# Patient Record
Sex: Female | Born: 1955 | Race: White | Hispanic: No | Smoking: Former smoker
Health system: Southern US, Community
[De-identification: ages and names within clinical notes are randomized; demographics above are authoritative.]

## PROBLEM LIST (undated history)

## (undated) DIAGNOSIS — E039 Hypothyroidism, unspecified: Secondary | ICD-10-CM

## (undated) DIAGNOSIS — M542 Cervicalgia: Secondary | ICD-10-CM

## (undated) DIAGNOSIS — J069 Acute upper respiratory infection, unspecified: Secondary | ICD-10-CM

## (undated) DIAGNOSIS — F32A Depression, unspecified: Secondary | ICD-10-CM

## (undated) DIAGNOSIS — M25519 Pain in unspecified shoulder: Secondary | ICD-10-CM

## (undated) DIAGNOSIS — L309 Dermatitis, unspecified: Secondary | ICD-10-CM

## (undated) DIAGNOSIS — F329 Major depressive disorder, single episode, unspecified: Secondary | ICD-10-CM

## (undated) DIAGNOSIS — C801 Malignant (primary) neoplasm, unspecified: Secondary | ICD-10-CM

## (undated) DIAGNOSIS — F419 Anxiety disorder, unspecified: Secondary | ICD-10-CM

## (undated) DIAGNOSIS — G43909 Migraine, unspecified, not intractable, without status migrainosus: Secondary | ICD-10-CM

## (undated) DIAGNOSIS — M1990 Unspecified osteoarthritis, unspecified site: Secondary | ICD-10-CM

## (undated) DIAGNOSIS — G4700 Insomnia, unspecified: Secondary | ICD-10-CM

## (undated) HISTORY — DX: Pain in unspecified shoulder: M25.519

## (undated) HISTORY — PX: COLONOSCOPY: SHX174

## (undated) HISTORY — DX: Dermatitis, unspecified: L30.9

## (undated) HISTORY — PX: APPENDECTOMY: SHX54

## (undated) HISTORY — DX: Depression, unspecified: F32.A

## (undated) HISTORY — DX: Migraine, unspecified, not intractable, without status migrainosus: G43.909

## (undated) HISTORY — DX: Acute upper respiratory infection, unspecified: J06.9

## (undated) HISTORY — DX: Anxiety disorder, unspecified: F41.9

## (undated) HISTORY — PX: TUBAL LIGATION: SHX77

## (undated) HISTORY — DX: Insomnia, unspecified: G47.00

## (undated) HISTORY — DX: Hypothyroidism, unspecified: E03.9

## (undated) HISTORY — PX: DILATION AND CURETTAGE OF UTERUS: SHX78

## (undated) HISTORY — DX: Cervicalgia: M54.2

## (undated) HISTORY — PX: TONSILLECTOMY: SUR1361

## (undated) HISTORY — PX: WISDOM TOOTH EXTRACTION: SHX21

## (undated) HISTORY — DX: Major depressive disorder, single episode, unspecified: F32.9

---

## 1997-03-15 ENCOUNTER — Ambulatory Visit (HOSPITAL_COMMUNITY): Admission: RE | Admit: 1997-03-15 | Discharge: 1997-03-15 | Payer: Self-pay | Admitting: Obstetrics and Gynecology

## 1997-08-20 ENCOUNTER — Ambulatory Visit (HOSPITAL_COMMUNITY): Admission: RE | Admit: 1997-08-20 | Discharge: 1997-08-20 | Payer: Self-pay | Admitting: Obstetrics and Gynecology

## 1997-09-20 ENCOUNTER — Ambulatory Visit (HOSPITAL_COMMUNITY): Admission: RE | Admit: 1997-09-20 | Discharge: 1997-09-20 | Payer: Self-pay | Admitting: Obstetrics and Gynecology

## 1997-10-15 ENCOUNTER — Inpatient Hospital Stay (HOSPITAL_COMMUNITY): Admission: AD | Admit: 1997-10-15 | Discharge: 1997-10-18 | Payer: Self-pay | Admitting: Obstetrics and Gynecology

## 1997-11-26 ENCOUNTER — Other Ambulatory Visit: Admission: RE | Admit: 1997-11-26 | Discharge: 1997-11-26 | Payer: Self-pay | Admitting: Obstetrics and Gynecology

## 1997-12-14 ENCOUNTER — Encounter (HOSPITAL_COMMUNITY): Admission: RE | Admit: 1997-12-14 | Discharge: 1998-03-14 | Payer: Self-pay | Admitting: *Deleted

## 1998-12-24 ENCOUNTER — Other Ambulatory Visit: Admission: RE | Admit: 1998-12-24 | Discharge: 1998-12-24 | Payer: Self-pay | Admitting: Obstetrics and Gynecology

## 1999-12-30 ENCOUNTER — Other Ambulatory Visit: Admission: RE | Admit: 1999-12-30 | Discharge: 1999-12-30 | Payer: Self-pay | Admitting: Obstetrics and Gynecology

## 2001-01-24 ENCOUNTER — Other Ambulatory Visit: Admission: RE | Admit: 2001-01-24 | Discharge: 2001-01-24 | Payer: Self-pay | Admitting: Obstetrics and Gynecology

## 2002-03-13 ENCOUNTER — Other Ambulatory Visit: Admission: RE | Admit: 2002-03-13 | Discharge: 2002-03-13 | Payer: Self-pay | Admitting: Obstetrics and Gynecology

## 2003-04-16 ENCOUNTER — Other Ambulatory Visit: Admission: RE | Admit: 2003-04-16 | Discharge: 2003-04-16 | Payer: Self-pay | Admitting: Obstetrics and Gynecology

## 2004-05-28 ENCOUNTER — Other Ambulatory Visit: Admission: RE | Admit: 2004-05-28 | Discharge: 2004-05-28 | Payer: Self-pay | Admitting: Obstetrics and Gynecology

## 2004-08-13 ENCOUNTER — Encounter: Admission: RE | Admit: 2004-08-13 | Discharge: 2004-08-13 | Payer: Self-pay | Admitting: Internal Medicine

## 2006-03-08 ENCOUNTER — Ambulatory Visit (HOSPITAL_COMMUNITY): Admission: RE | Admit: 2006-03-08 | Discharge: 2006-03-08 | Payer: Self-pay | Admitting: *Deleted

## 2009-08-17 ENCOUNTER — Emergency Department (HOSPITAL_BASED_OUTPATIENT_CLINIC_OR_DEPARTMENT_OTHER): Admission: EM | Admit: 2009-08-17 | Discharge: 2009-08-17 | Payer: Self-pay | Admitting: Emergency Medicine

## 2011-01-26 ENCOUNTER — Other Ambulatory Visit: Payer: Self-pay | Admitting: Obstetrics and Gynecology

## 2014-02-21 ENCOUNTER — Ambulatory Visit: Payer: PRIVATE HEALTH INSURANCE | Admitting: Neurology

## 2014-03-13 ENCOUNTER — Other Ambulatory Visit: Payer: Self-pay | Admitting: Obstetrics and Gynecology

## 2014-03-15 LAB — CYTOLOGY - PAP

## 2014-11-01 ENCOUNTER — Encounter: Payer: Self-pay | Admitting: Neurology

## 2014-11-01 ENCOUNTER — Ambulatory Visit (INDEPENDENT_AMBULATORY_CARE_PROVIDER_SITE_OTHER): Payer: 59 | Admitting: Neurology

## 2014-11-01 VITALS — BP 112/68 | HR 62 | Ht 63.0 in | Wt 113.0 lb

## 2014-11-01 DIAGNOSIS — G43709 Chronic migraine without aura, not intractable, without status migrainosus: Secondary | ICD-10-CM

## 2014-11-01 DIAGNOSIS — IMO0002 Reserved for concepts with insufficient information to code with codable children: Secondary | ICD-10-CM

## 2014-11-01 MED ORDER — SUMATRIPTAN SUCCINATE REFILL 6 MG/0.5ML ~~LOC~~ SOCT: 6.0000 mg | SUBCUTANEOUS | Status: DC | PRN

## 2014-11-01 MED ORDER — NORTRIPTYLINE HCL 10 MG PO CAPS: ORAL_CAPSULE | ORAL | Status: DC

## 2014-11-01 NOTE — Progress Notes (Signed)
PATIENT: Abigail Stevens DOB: 10-18-1955  Chief Complaint  Patient presents with  . Migraine    She has been having migraines since was 59 years old.  They have recently become worse and more frequent.  This week she has had three migraines.  She has tried and failed multiple medications in the past.  She has tried magnesium, chiropractic care, acupuncture, eye exams, neck/back stretches, massage therapy and diet. She limits her OTC NSAID use.  She sometimes gets relief with sumatriptan, ice and rest in a dark, quiet place.     HISTORICAL  Abigail Stevens is a 59 years old right-handed female, seen in refer by her primary care physician from [facility] Dr. Jani Gravel in November 01 2014 for evaluation of frequent headaches  She reported a history of migraine headaches since 29 years old, her father also suffered typical migraines, her typical migraines are lateralized severe pounding headache was associated light noise sensitivity, nauseous, lasting for a few hours.  She has gone through a lot of stress since 2015, complains of increased headache over the past 2 years, for a while, she has been taking frequent ibuprofen, Tylenol, without helping her symptoms, now she has tapered off daily analgesic use, continue complains frequent almost daily variable degree of headaches.  She was not able to identify any clear triggers, she also woke up with severe right side retro-orbital area pounding headaches, spreading from right frontal parietal to right neck and shoulder region, 8 out of 10, she has been taking Imitrex tablet, which helps her some, but often she has to take second dose, she used upper 9 tablet monthly supply within 2 weeks,  Over the years, she has tried different preventive medications, butterbur, Inderal 60 mg twice a day, which seems to help her some, but the benefit does not sustain, she also has tried acupuncture, massage, chiropractor, ice pack, magnesium  oxide, neck stretching exercise, ergonomic approach with limited help.  She is now using frequent ice pack, Imitrex 100 mg tablets as needed, Relpax works better, but it cost 150 $ for 4 tablets   REVIEW OF SYSTEMS: Full 14 system review of systems performed and notable only for weight gain, fatigue, hearing loss, ringing ears, eye pain, feeling hot, feeling cold, achy muscles, running nose, headaches, depression, anxiety, decreased energy, disinteresting activities  ALLERGIES: Not on File  HOME MEDICATIONS: Current Outpatient Prescriptions  Medication Sig Dispense Refill  . clonazePAM (KLONOPIN) 0.5 MG tablet as needed.     . conjugated estrogens (PREMARIN) vaginal cream Place 1 Applicatorful vaginally daily.    Marland Kitchen escitalopram (LEXAPRO) 10 MG tablet Take 10 mg by mouth daily.    Marland Kitchen levothyroxine (SYNTHROID, LEVOTHROID) 50 MCG tablet Take 50 mcg by mouth daily before breakfast. Takes two days per week.    . levothyroxine (SYNTHROID, LEVOTHROID) 75 MCG tablet Takes 5 days per week.    . propranolol ER (INDERAL LA) 60 MG 24 hr capsule Take 60 mg by mouth 2 (two) times daily.   2  . SUMAtriptan (IMITREX) 100 MG tablet TAKE 1 TABLET FOR MIGRAINE RELIEF. MAY REPEAT 2 HOURS LATER. MAX OF 2 PER DAY 30  6  . UNABLE TO FIND Med Name: Butterbur supplement for migraines.     No current facility-administered medications for this visit.    PAST MEDICAL HISTORY: Past Medical History  Diagnosis Date  . Hypothyroidism   . Depression   . Anxiety   . Insomnia   . Neck  pain   . Shoulder pain   . Migraine     PAST SURGICAL HISTORY: Past Surgical History  Procedure Laterality Date  . Cesarean section    . Tonsillectomy    . Appendectomy      FAMILY HISTORY: Family History  Problem Relation Age of Onset  . Aneurysm Mother   . Heart disease Father   . Kidney disease Father   . Diabetes Father   . Migraines Father   . Dementia Mother     SOCIAL HISTORY:  Social History   Social  History  . Marital Status: Single    Spouse Name: N/A  . Number of Children: 2  . Years of Education: 16   Occupational History  . HR Consultant    Social History Main Topics  . Smoking status: Former Smoker    Types: Cigarettes  . Smokeless tobacco: Not on file     Comment: Quit January 2007  . Alcohol Use: 0.0 oz/week    0 Standard drinks or equivalent per week     Comment: One drink per day and 2 drinks per day on the weekends.  . Drug Use: Not on file  . Sexual Activity: Not on file   Other Topics Concern  . Not on file   Social History Narrative   Lives at home with son and daughter.   Right-handed.   1-2 cups caffeine per day.     PHYSICAL EXAM   Filed Vitals:   11/01/14 1019  BP: 112/68  Pulse: 62  Height: 5\' 3"  (1.6 m)  Weight: 113 lb (51.256 kg)    Not recorded      Body mass index is 20.02 kg/(m^2).  PHYSICAL EXAMNIATION:  Gen: NAD, conversant, well nourised, obese, well groomed                     Cardiovascular: Regular rate rhythm, no peripheral edema, warm, nontender. Eyes: Conjunctivae clear without exudates or hemorrhage Neck: Supple, no carotid bruise. Pulmonary: Clear to auscultation bilaterally   NEUROLOGICAL EXAM:  MENTAL STATUS: Speech:    Speech is normal; fluent and spontaneous with normal comprehension.  Cognition:     Orientation to time, place and person     Normal recent and remote memory     Normal Attention span and concentration     Normal Language, naming, repeating,spontaneous speech     Fund of knowledge   CRANIAL NERVES: CN II: Visual fields are full to confrontation. Fundoscopic exam is normal with sharp discs and no vascular changes. Pupils are round equal and briskly reactive to light. CN III, IV, VI: extraocular movement are normal. No ptosis. CN V: Facial sensation is intact to pinprick in all 3 divisions bilaterally. Corneal responses are intact.  CN VII: Face is symmetric with normal eye closure and  smile. CN VIII: Hearing is normal to rubbing fingers CN IX, X: Palate elevates symmetrically. Phonation is normal. CN XI: Head turning and shoulder shrug are intact CN XII: Tongue is midline with normal movements and no atrophy.  MOTOR: There is no pronator drift of out-stretched arms. Muscle bulk and tone are normal. Muscle strength is normal.  REFLEXES: Reflexes are 2+ and symmetric at the biceps, triceps, knees, and ankles. Plantar responses are flexor.  SENSORY: Intact to light touch, pinprick, position sense, and vibration sense are intact in fingers and toes.  COORDINATION: Rapid alternating movements and fine finger movements are intact. There is no dysmetria on finger-to-nose and heel-knee-shin.  GAIT/STANCE: Posture is normal. Gait is steady with normal steps, base, arm swing, and turning. Heel and toe walking are normal. Tandem gait is normal.  Romberg is absent.   DIAGNOSTIC DATA (LABS, IMAGING, TESTING) - I reviewed patient records, labs, notes, testing and imaging myself where available.   ASSESSMENT AND PLAN  Abigail Stevens is a 59 y.o. female   Chronic migraine headaches  Has tried and failed Inderal, butterbur, magnesium oxide  Add on nortriptyline titrating to 10 mg 2 tablets every night  Imitrex injection as needed  If she continue have frequent headaches, may consider Botox injection as preventive medications Depression anxiety  She is taking Lexapro 10 mg daily,  Marcial Pacas, M.D. Ph.D.  [facility] [address], [place], [place] [address] Ph: [phone] Fax: [phone]  CC: Dr. Jani Gravel

## 2014-12-03 ENCOUNTER — Encounter: Payer: Self-pay | Admitting: Neurology

## 2014-12-03 ENCOUNTER — Ambulatory Visit (INDEPENDENT_AMBULATORY_CARE_PROVIDER_SITE_OTHER): Payer: 59 | Admitting: Neurology

## 2014-12-03 VITALS — BP 110/68 | HR 72 | Ht 63.0 in | Wt 113.0 lb

## 2014-12-03 DIAGNOSIS — G43009 Migraine without aura, not intractable, without status migrainosus: Secondary | ICD-10-CM | POA: Diagnosis not present

## 2014-12-03 NOTE — Progress Notes (Signed)
Chief Complaint  Patient presents with  . Migraine    She is ony taking nortriptyline 10mg  at bedtime but feels the dose has been beneficial for her migraines.  Says she is only using sumatriptan about once every ten days.      PATIENT: Abigail Stevens DOB: Oct 27, 1955  Chief Complaint  Patient presents with  . Migraine    She is ony taking nortriptyline 10mg  at bedtime but feels the dose has been beneficial for her migraines.  Says she is only using sumatriptan about once every ten days.     HISTORICAL  Abigail Stevens is a 59 years old right-handed female, seen in refer by her primary care physician from [facility] Dr. Jani Gravel in November 01 2014 for evaluation of frequent headaches  She reported a history of migraine headaches since 12 years old, her father also suffered typical migraines, her typical migraines are lateralized severe pounding headache was associated light noise sensitivity, nauseous, lasting for a few hours.  She has gone through a lot of stress since 2015, complains of increased headache over the past 2 years, for a while, she has been taking frequent ibuprofen, Tylenol, without helping her symptoms, now she has tapered off daily analgesic use, continue complains frequent almost daily variable degree of headaches.  She was not able to identify any clear triggers, she also woke up with severe right side retro-orbital area pounding headaches, spreading from right frontal parietal to right neck and shoulder region, 8 out of 10, she has been taking Imitrex tablet, which helps her some, but often she has to take second dose, she used upper 9 tablet monthly supply within 2 weeks,  Over the years, she has tried different preventive medications, butterbur, Inderal 60 mg twice a day, which seems to help her some, but the benefit does not sustain, she also has tried acupuncture, massage, chiropractor, ice pack, magnesium oxide, neck stretching exercise,  ergonomic approach with limited help.  She is now using frequent ice pack, Imitrex 100 mg tablets as needed, Relpax works better, but it cost 150 $ for 4 tablets  UPDATE Dec 03 2014: She is taking Nortriptyline 10mg  po qhs, her headache has much improved, she still gets average one headache every week, imitrex 100mg  as needed has been helpful, she did not get the chance to try Imitrex injection   REVIEW OF SYSTEMS: Full 14 system review of systems performed and notable only for weight gain, fatigue, hearing loss, ringing ears, eye pain, feeling hot, feeling cold, achy muscles, running nose, headaches, depression, anxiety, decreased energy, disinteresting activities  ALLERGIES: Not on File  HOME MEDICATIONS: Current Outpatient Prescriptions  Medication Sig Dispense Refill  . clonazePAM (KLONOPIN) 0.5 MG tablet as needed.     . conjugated estrogens (PREMARIN) vaginal cream Place 1 Applicatorful vaginally daily.    Marland Kitchen escitalopram (LEXAPRO) 10 MG tablet Take 10 mg by mouth daily.    Marland Kitchen levothyroxine (SYNTHROID, LEVOTHROID) 50 MCG tablet Take 50 mcg by mouth daily before breakfast. Takes two days per week.    . levothyroxine (SYNTHROID, LEVOTHROID) 75 MCG tablet Takes 5 days per week.    . nortriptyline (PAMELOR) 10 MG capsule One po qhs xone week, then 2 po qhs 60 capsule 11  . propranolol ER (INDERAL LA) 60 MG 24 hr capsule Take 60 mg by mouth 2 (two) times daily.   2  . SUMAtriptan (IMITREX) 100 MG tablet TAKE 1 TABLET FOR MIGRAINE RELIEF. MAY REPEAT 2 HOURS LATER.  MAX OF 2 PER DAY 30  6  . SUMAtriptan Succinate Refill (IMITREX STATDOSE REFILL) 6 MG/0.5ML SOCT Inject 6 mg into the skin as needed. 5 mL 6  . UNABLE TO FIND Med Name: Butterbur supplement for migraines.     No current facility-administered medications for this visit.    PAST MEDICAL HISTORY: Past Medical History  Diagnosis Date  . Hypothyroidism   . Depression   . Anxiety   . Insomnia   . Neck pain   . Shoulder pain     . Migraine     PAST SURGICAL HISTORY: Past Surgical History  Procedure Laterality Date  . Cesarean section    . Tonsillectomy    . Appendectomy      FAMILY HISTORY: Family History  Problem Relation Age of Onset  . Aneurysm Mother   . Heart disease Father   . Kidney disease Father   . Diabetes Father   . Migraines Father   . Dementia Mother     SOCIAL HISTORY:  Social History   Social History  . Marital Status: Single    Spouse Name: N/A  . Number of Children: 2  . Years of Education: 16   Occupational History  . HR Consultant    Social History Main Topics  . Smoking status: Former Smoker    Types: Cigarettes  . Smokeless tobacco: Not on file     Comment: Quit January 2007  . Alcohol Use: 0.0 oz/week    0 Standard drinks or equivalent per week     Comment: One drink per day and 2 drinks per day on the weekends.  . Drug Use: Not on file  . Sexual Activity: Not on file   Other Topics Concern  . Not on file   Social History Narrative   Lives at home with son and daughter.   Right-handed.   1-2 cups caffeine per day.     PHYSICAL EXAM   Filed Vitals:   12/03/14 0859  BP: 110/68  Pulse: 72  Height: 5\' 3"  (1.6 m)  Weight: 113 lb (51.256 kg)    Not recorded      Body mass index is 20.02 kg/(m^2).  PHYSICAL EXAMNIATION:  Gen: NAD, conversant, well nourised, obese, well groomed                     Cardiovascular: Regular rate rhythm, no peripheral edema, warm, nontender. Eyes: Conjunctivae clear without exudates or hemorrhage Neck: Supple, no carotid bruise. Pulmonary: Clear to auscultation bilaterally   NEUROLOGICAL EXAM:  MENTAL STATUS: Speech:    Speech is normal; fluent and spontaneous with normal comprehension.  Cognition:     Orientation to time, place and person     Normal recent and remote memory     Normal Attention span and concentration     Normal Language, naming, repeating,spontaneous speech     Fund of knowledge    CRANIAL NERVES: CN II: Visual fields are full to confrontation. Fundoscopic exam is normal with sharp discs and no vascular changes. Pupils are round equal and briskly reactive to light. CN III, IV, VI: extraocular movement are normal. No ptosis. CN V: Facial sensation is intact to pinprick in all 3 divisions bilaterally. Corneal responses are intact.  CN VII: Face is symmetric with normal eye closure and smile. CN VIII: Hearing is normal to rubbing fingers CN IX, X: Palate elevates symmetrically. Phonation is normal. CN XI: Head turning and shoulder shrug are intact CN XII: Tongue  is midline with normal movements and no atrophy.  MOTOR: There is no pronator drift of out-stretched arms. Muscle bulk and tone are normal. Muscle strength is normal.  REFLEXES: Reflexes are 2+ and symmetric at the biceps, triceps, knees, and ankles. Plantar responses are flexor.  SENSORY: Intact to light touch, pinprick, position sense, and vibration sense are intact in fingers and toes.  COORDINATION: Rapid alternating movements and fine finger movements are intact. There is no dysmetria on finger-to-nose and heel-knee-shin.    GAIT/STANCE: Posture is normal. Gait is steady with normal steps, base, arm swing, and turning. Heel and toe walking are normal. Tandem gait is normal.  Romberg is absent.   DIAGNOSTIC DATA (LABS, IMAGING, TESTING) - I reviewed patient records, labs, notes, testing and imaging myself where available.   ASSESSMENT AND PLAN  Abigail Stevens is a 59 y.o. female   Chronic migraine headaches  Has tried and failed Inderal, butterbur, magnesium oxide  Keep nortriptyline 10 mg 2 tablets every night  Imitrex injection as needed  If she continue have frequent headaches, may consider Botox injection as preventive medications Depression anxiety  She is taking Lexapro 10 mg daily,  Marcial Pacas, M.D. Ph.D.  [facility] [address] [place], [place]  [address] Ph: [phone] Fax: [phone]  CC: Dr. Jani Gravel

## 2015-06-04 ENCOUNTER — Encounter: Payer: Self-pay | Admitting: Neurology

## 2015-06-04 ENCOUNTER — Ambulatory Visit (INDEPENDENT_AMBULATORY_CARE_PROVIDER_SITE_OTHER): Payer: 59 | Admitting: Neurology

## 2015-06-04 VITALS — BP 126/76 | HR 60 | Ht 63.0 in | Wt 112.5 lb

## 2015-06-04 DIAGNOSIS — G43709 Chronic migraine without aura, not intractable, without status migrainosus: Secondary | ICD-10-CM | POA: Diagnosis not present

## 2015-06-04 DIAGNOSIS — IMO0002 Reserved for concepts with insufficient information to code with codable children: Secondary | ICD-10-CM

## 2015-06-04 MED ORDER — RIZATRIPTAN BENZOATE 10 MG PO TBDP
10.0000 mg | ORAL_TABLET | ORAL | Status: DC | PRN
Start: 2015-06-04 — End: 2015-12-04

## 2015-06-04 MED ORDER — NORTRIPTYLINE HCL 10 MG PO CAPS: 20.0000 mg | ORAL_CAPSULE | Freq: Every day | ORAL | Status: DC

## 2015-06-04 MED ORDER — ONDANSETRON 4 MG PO TBDP: 4.0000 mg | ORAL_TABLET | Freq: Three times a day (TID) | ORAL | Status: DC | PRN

## 2015-06-04 MED ORDER — NORTRIPTYLINE HCL 25 MG PO CAPS: 50.0000 mg | ORAL_CAPSULE | Freq: Every day | ORAL | Status: DC

## 2015-06-04 NOTE — Progress Notes (Signed)
Chief Complaint  Patient presents with  . Migraine    Reports improvement in her headaches.  Estimates only getting two migraines per month.  Sumatriptan is helpful in resolving her pain.   Chief Complaint  Patient presents with  . Migraine    Reports improvement in her headaches.  Estimates only getting two migraines per month.  Sumatriptan is helpful in resolving her pain.      PATIENT: Abigail Stevens DOB: February 22, 1955  Chief Complaint  Patient presents with  . Migraine    Reports improvement in her headaches.  Estimates only getting two migraines per month.  Sumatriptan is helpful in resolving her pain.     HISTORICAL  Abigail Stevens is a 60 years old right-handed female, seen in refer by her primary care physician from [facility] Dr. Jani Gravel in November 01 2014 for evaluation of frequent headaches  She reported a history of migraine headaches since 87 years old, her father also suffered typical migraines, her typical migraines are lateralized severe pounding headache was associated light noise sensitivity, nauseous, lasting for a few hours.  She has gone through a lot of stress since 2015, complains of increased headache over the past 2 years, for a while, she has been taking frequent ibuprofen, Tylenol, without helping her symptoms, now she has tapered off daily analgesic use, continue complains frequent almost daily variable degree of headaches.  She was not able to identify any clear triggers, she also woke up with severe right side retro-orbital area pounding headaches, spreading from right frontal parietal to right neck and shoulder region, 8 out of 10, she has been taking Imitrex tablet, which helps her some, but often she has to take second dose, she used upper 9 tablet monthly supply within 2 weeks,  Over the years, she has tried different preventive medications, butterbur, Inderal 60 mg twice a day, which seems to help her some, but the benefit does not  sustain, she also has tried acupuncture, massage, chiropractor, ice pack, magnesium oxide, neck stretching exercise, ergonomic approach with limited help.  She is now using frequent ice pack, Imitrex 100 mg tablets as needed, Relpax works better, but it cost 150 $ for 4 tablets  UPDATE Dec 03 2014: She is taking Nortriptyline 10mg  po qhs, her headache has much improved, she still gets average one headache every week, imitrex 100mg  as needed has been helpful, she did not get the chance to try Imitrex injection  UPDATE April 25th 2017:  Her headache overall has much improved, she is able to tolerating nortriptyline 10 mg 2 tablets every night, and also taking Inderal 60 mg daily, she has average 2-3 mild headaches each week, use Imitrex tablets, about once or twice each months she get more severe headache requiring Imitrex injection, which has been helpful, she also has significant nausea or vomiting with her severe migraine headaches, her headache often started at the left neck, spreading forward to become retro-orbital area headache, I also suggested thermalcare heating pad  REVIEW OF SYSTEMS: Full 14 system review of systems performed and notable only for ear discharge, hearing loss, ringing ears, runny nose, neck pain, memory loss, headache  ALLERGIES: Not on File  HOME MEDICATIONS: Current Outpatient Prescriptions  Medication Sig Dispense Refill  . clonazePAM (KLONOPIN) 0.5 MG tablet as needed.     . conjugated estrogens (PREMARIN) vaginal cream Place 1 Applicatorful vaginally 2 (two) times a week.     . escitalopram (LEXAPRO) 10 MG tablet Take 10 mg  by mouth daily.    Marland Kitchen levothyroxine (SYNTHROID, LEVOTHROID) 50 MCG tablet Taking every other day.    . levothyroxine (SYNTHROID, LEVOTHROID) 75 MCG tablet Taking every other day.    . nortriptyline (PAMELOR) 10 MG capsule One po qhs xone week, then 2 po qhs 60 capsule 11  . propranolol ER (INDERAL LA) 60 MG 24 hr capsule Take 60 mg by mouth 2  (two) times daily.   2  . SUMAtriptan (IMITREX) 100 MG tablet TAKE 1 TABLET FOR MIGRAINE RELIEF. MAY REPEAT 2 HOURS LATER. MAX OF 2 PER DAY 30  6  . SUMAtriptan Succinate Refill (IMITREX STATDOSE REFILL) 6 MG/0.5ML SOCT Inject 6 mg into the skin as needed. 5 mL 6   No current facility-administered medications for this visit.    PAST MEDICAL HISTORY: Past Medical History  Diagnosis Date  . Hypothyroidism   . Depression   . Anxiety   . Insomnia   . Neck pain   . Shoulder pain   . Migraine     PAST SURGICAL HISTORY: Past Surgical History  Procedure Laterality Date  . Cesarean section    . Tonsillectomy    . Appendectomy      FAMILY HISTORY: Family History  Problem Relation Age of Onset  . Aneurysm Mother   . Heart disease Father   . Kidney disease Father   . Diabetes Father   . Migraines Father   . Dementia Mother     SOCIAL HISTORY:  Social History   Social History  . Marital Status: Single    Spouse Name: N/A  . Number of Children: 2  . Years of Education: 16   Occupational History  . HR Consultant    Social History Main Topics  . Smoking status: Former Smoker    Types: Cigarettes  . Smokeless tobacco: Not on file     Comment: Quit January 2007  . Alcohol Use: 0.0 oz/week    0 Standard drinks or equivalent per week     Comment: One drink per day and 2 drinks per day on the weekends.  . Drug Use: Not on file  . Sexual Activity: Not on file   Other Topics Concern  . Not on file   Social History Narrative   Lives at home with son and daughter.   Right-handed.   1-2 cups caffeine per day.     PHYSICAL EXAM   Filed Vitals:   06/04/15 0844  BP: 126/76  Pulse: 60  Height: 5\' 3"  (1.6 m)  Weight: 112 lb 8 oz (51.03 kg)    Not recorded      Body mass index is 19.93 kg/(m^2).  PHYSICAL EXAMNIATION:  Gen: NAD, conversant, well nourised, obese, well groomed                     Cardiovascular: Regular rate rhythm, no peripheral edema, warm,  nontender. Eyes: Conjunctivae clear without exudates or hemorrhage Neck: Supple, no carotid bruise. Pulmonary: Clear to auscultation bilaterally   NEUROLOGICAL EXAM:  MENTAL STATUS: Speech:    Speech is normal; fluent and spontaneous with normal comprehension.  Cognition:     Orientation to time, place and person     Normal recent and remote memory     Normal Attention span and concentration     Normal Language, naming, repeating,spontaneous speech     Fund of knowledge   CRANIAL NERVES: CN II: Visual fields are full to confrontation. Fundoscopic exam is normal with sharp discs  and no vascular changes. Pupils are round equal and briskly reactive to light. CN III, IV, VI: extraocular movement are normal. No ptosis. CN V: Facial sensation is intact to pinprick in all 3 divisions bilaterally. Corneal responses are intact.  CN VII: Face is symmetric with normal eye closure and smile. CN VIII: Hearing is normal to rubbing fingers, bilateral tympanic membrane were intact, CN IX, X: Palate elevates symmetrically. Phonation is normal. CN XI: Head turning and shoulder shrug are intact  CN XII: Tongue is midline with normal movements and no atrophy.  MOTOR: There is no pronator drift of out-stretched arms. Muscle bulk and tone are normal. Muscle strength is normal.  REFLEXES: Reflexes are 2+ and symmetric at the biceps, triceps, knees, and ankles. Plantar responses are flexor.  SENSORY: Intact to light touch, pinprick, position sense, and vibration sense are intact in fingers and toes.  COORDINATION: Rapid alternating movements and fine finger movements are intact. There is no dysmetria on finger-to-nose and heel-knee-shin.    GAIT/STANCE: Posture is normal. Gait is steady with normal steps, base, arm swing, and turning. Heel and toe walking are normal. Tandem gait is normal.  Romberg is absent.   DIAGNOSTIC DATA (LABS, IMAGING, TESTING) - I reviewed patient records, labs, notes,  testing and imaging myself where available.   ASSESSMENT AND PLAN  EWA KOT is a 60 y.o. female   Chronic migraine headaches  Has tried and failed Inderal, butterbur, magnesium oxide  Increase nortriptyline 25 mg 2 tablets every night  Imitrex injection as needed  Maxalt dissolvable, Zofran dissolvable tablets as needed  Depression anxiety  She is taking Lexapro 10 mg daily,  Marcial Pacas, M.D. Ph.D.  [facility] [address] [place], [place] [address] Ph: [phone] Fax: [phone]  CC: Dr. Jani Gravel

## 2015-11-27 ENCOUNTER — Encounter (HOSPITAL_COMMUNITY): Payer: Self-pay | Admitting: *Deleted

## 2015-11-27 ENCOUNTER — Ambulatory Visit (HOSPITAL_COMMUNITY): Payer: Self-pay | Admitting: Plastic Surgery

## 2015-11-27 NOTE — Progress Notes (Signed)
Pt denies SOB, chest pain, and being under the care of a cardiologist. Pt denies having a stress test, echo and cardiac cath. Pt denies having a chest x ray and EKG within the last year. Pt made aware to stop taking Aspirin, vitamins, fish oil and herbal medications. Do not take any NSAIDs ie: Ibuprofen, Advil, Naproxen, BC and Goody Powder or any medication containing Aspirin. Pt verbalized understanding of all pre-op instructions.

## 2015-11-28 ENCOUNTER — Ambulatory Visit (HOSPITAL_COMMUNITY): Payer: 59 | Admitting: Certified Registered Nurse Anesthetist

## 2015-11-28 ENCOUNTER — Ambulatory Visit (HOSPITAL_COMMUNITY)
Admission: RE | Admit: 2015-11-28 | Discharge: 2015-11-28 | Disposition: A | Discharge status: Home / Self Care | Payer: 59 | Source: Ambulatory Visit | Attending: Plastic Surgery | Admitting: Plastic Surgery

## 2015-11-28 ENCOUNTER — Encounter (HOSPITAL_COMMUNITY)
Admission: RE | Disposition: A | Discharge status: Home / Self Care | Payer: Self-pay | Source: Ambulatory Visit | Attending: Plastic Surgery

## 2015-11-28 ENCOUNTER — Encounter (HOSPITAL_COMMUNITY): Payer: Self-pay | Admitting: Certified Registered Nurse Anesthetist

## 2015-11-28 DIAGNOSIS — F329 Major depressive disorder, single episode, unspecified: Secondary | ICD-10-CM | POA: Diagnosis not present

## 2015-11-28 DIAGNOSIS — Z79890 Hormone replacement therapy (postmenopausal): Secondary | ICD-10-CM | POA: Diagnosis not present

## 2015-11-28 DIAGNOSIS — C4359 Malignant melanoma of other part of trunk: Secondary | ICD-10-CM | POA: Diagnosis not present

## 2015-11-28 DIAGNOSIS — G43909 Migraine, unspecified, not intractable, without status migrainosus: Secondary | ICD-10-CM | POA: Insufficient documentation

## 2015-11-28 DIAGNOSIS — Z79899 Other long term (current) drug therapy: Secondary | ICD-10-CM | POA: Diagnosis not present

## 2015-11-28 DIAGNOSIS — F419 Anxiety disorder, unspecified: Secondary | ICD-10-CM | POA: Insufficient documentation

## 2015-11-28 DIAGNOSIS — Z85820 Personal history of malignant melanoma of skin: Secondary | ICD-10-CM | POA: Insufficient documentation

## 2015-11-28 DIAGNOSIS — Z428 Encounter for other plastic and reconstructive surgery following medical procedure or healed injury: Secondary | ICD-10-CM | POA: Diagnosis not present

## 2015-11-28 DIAGNOSIS — E039 Hypothyroidism, unspecified: Secondary | ICD-10-CM | POA: Insufficient documentation

## 2015-11-28 DIAGNOSIS — Z7951 Long term (current) use of inhaled steroids: Secondary | ICD-10-CM | POA: Insufficient documentation

## 2015-11-28 DIAGNOSIS — Z87891 Personal history of nicotine dependence: Secondary | ICD-10-CM | POA: Diagnosis not present

## 2015-11-28 HISTORY — PX: DEBRIDEMENT AND CLOSURE WOUND: SHX5614

## 2015-11-28 HISTORY — PX: MELANOMA EXCISION: SHX5266

## 2015-11-28 HISTORY — DX: Malignant (primary) neoplasm, unspecified: C80.1

## 2015-11-28 LAB — CBC
HCT: 43.8 % (ref 36.0–46.0)
HEMOGLOBIN: 14.8 g/dL (ref 12.0–15.0)
MCH: 31.4 pg (ref 26.0–34.0)
MCHC: 33.8 g/dL (ref 30.0–36.0)
MCV: 92.8 fL (ref 78.0–100.0)
PLATELETS: 247 10*3/uL (ref 150–400)
RBC: 4.72 MIL/uL (ref 3.87–5.11)
RDW: 12.6 % (ref 11.5–15.5)
WBC: 5.9 10*3/uL (ref 4.0–10.5)

## 2015-11-28 LAB — BASIC METABOLIC PANEL
Anion gap: 9 (ref 5–15)
BUN: 13 mg/dL (ref 6–20)
CALCIUM: 9.8 mg/dL (ref 8.9–10.3)
CO2: 27 mmol/L (ref 22–32)
CREATININE: 0.83 mg/dL (ref 0.44–1.00)
Chloride: 103 mmol/L (ref 101–111)
GFR calc non Af Amer: >60 mL/min (ref >60)
Glucose, Bld: 78 mg/dL (ref 65–99)
Potassium: 3.8 mmol/L (ref 3.5–5.1)
SODIUM: 139 mmol/L (ref 135–145)

## 2015-11-28 SURGERY — DEBRIDEMENT, WOUND, WITH CLOSURE
Anesthesia: Monitor Anesthesia Care | Site: Face | Laterality: Right

## 2015-11-28 MED ORDER — LIDOCAINE 2% (20 MG/ML) 5 ML SYRINGE
INTRAMUSCULAR | Status: AC
  Filled 2015-11-28: qty 5

## 2015-11-28 MED ORDER — FENTANYL CITRATE (PF) 100 MCG/2ML IJ SOLN
INTRAMUSCULAR | Status: AC
  Filled 2015-11-28: qty 2

## 2015-11-28 MED ORDER — BUPIVACAINE HCL (PF) 0.25 % IJ SOLN
INTRAMUSCULAR | Status: AC
  Filled 2015-11-28: qty 30

## 2015-11-28 MED ORDER — SODIUM BICARBONATE 4 % IV SOLN
INTRAVENOUS | Status: DC | PRN
  Administered 2015-11-28: 1 mL via SUBCUTANEOUS

## 2015-11-28 MED ORDER — PROPOFOL 10 MG/ML IV BOLUS
INTRAVENOUS | Status: AC
  Filled 2015-11-28: qty 20

## 2015-11-28 MED ORDER — CEFAZOLIN SODIUM 1 G IJ SOLR
INTRAMUSCULAR | Status: DC | PRN
  Administered 2015-11-28: 1 g via INTRAMUSCULAR

## 2015-11-28 MED ORDER — ONDANSETRON HCL 4 MG/2ML IJ SOLN
INTRAMUSCULAR | Status: AC
  Filled 2015-11-28: qty 4

## 2015-11-28 MED ORDER — LIDOCAINE-EPINEPHRINE (PF) 1 %-1:200000 IJ SOLN
INTRAMUSCULAR | Status: DC | PRN
  Administered 2015-11-28: 9 mL

## 2015-11-28 MED ORDER — LACTATED RINGERS IV SOLN
INTRAVENOUS | Status: DC | PRN
  Administered 2015-11-28: 13:00:00 via INTRAVENOUS

## 2015-11-28 MED ORDER — ROCURONIUM BROMIDE 10 MG/ML (PF) SYRINGE
PREFILLED_SYRINGE | INTRAVENOUS | Status: AC
  Filled 2015-11-28: qty 10

## 2015-11-28 MED ORDER — PHENYLEPHRINE 40 MCG/ML (10ML) SYRINGE FOR IV PUSH (FOR BLOOD PRESSURE SUPPORT)
PREFILLED_SYRINGE | INTRAVENOUS | Status: AC
  Filled 2015-11-28: qty 10

## 2015-11-28 MED ORDER — BUPIVACAINE-EPINEPHRINE (PF) 0.5% -1:200000 IJ SOLN
INTRAMUSCULAR | Status: AC
  Filled 2015-11-28: qty 30

## 2015-11-28 MED ORDER — LIDOCAINE HCL (PF) 1 % IJ SOLN
INTRAMUSCULAR | Status: AC
  Filled 2015-11-28: qty 30

## 2015-11-28 MED ORDER — MIDAZOLAM HCL 5 MG/5ML IJ SOLN
INTRAMUSCULAR | Status: DC | PRN
  Administered 2015-11-28: 2 mg via INTRAVENOUS

## 2015-11-28 MED ORDER — LACTATED RINGERS IV SOLN
INTRAVENOUS | Status: DC
  Administered 2015-11-28: 13:00:00 via INTRAVENOUS

## 2015-11-28 MED ORDER — WHITE PETROLATUM GEL
Status: DC | PRN
  Administered 2015-11-28: 2 via TOPICAL

## 2015-11-28 MED ORDER — MIDAZOLAM HCL 2 MG/2ML IJ SOLN
INTRAMUSCULAR | Status: AC
  Filled 2015-11-28: qty 2

## 2015-11-28 MED ORDER — SODIUM BICARBONATE 4 % IV SOLN
INTRAVENOUS | Status: AC
  Filled 2015-11-28: qty 5

## 2015-11-28 MED ORDER — FENTANYL CITRATE (PF) 100 MCG/2ML IJ SOLN
INTRAMUSCULAR | Status: DC | PRN
  Administered 2015-11-28: 25 ug via INTRAVENOUS
  Administered 2015-11-28: 50 ug via INTRAVENOUS
  Administered 2015-11-28: 25 ug via INTRAVENOUS

## 2015-11-28 MED ORDER — LIDOCAINE-EPINEPHRINE (PF) 1 %-1:200000 IJ SOLN
INTRAMUSCULAR | Status: AC
  Filled 2015-11-28: qty 30

## 2015-11-28 SURGICAL SUPPLY — 44 items
BLADE 10 SAFETY STRL DISP (BLADE) IMPLANT
BLADE SURG 15 STRL LF DISP TIS (BLADE) ×2 IMPLANT
BLADE SURG 15 STRL SS (BLADE) ×2
CANISTER SUCTION 2500CC (MISCELLANEOUS) ×4 IMPLANT
CHLORAPREP W/TINT 26ML (MISCELLANEOUS) ×4 IMPLANT
COVER SURGICAL LIGHT HANDLE (MISCELLANEOUS) ×4 IMPLANT
DERMABOND ADVANCED (GAUZE/BANDAGES/DRESSINGS) ×2
DERMABOND ADVANCED .7 DNX12 (GAUZE/BANDAGES/DRESSINGS) ×2 IMPLANT
DRAPE LAPAROSCOPIC ABDOMINAL (DRAPES) IMPLANT
DRAPE U-SHAPE 76X120 STRL (DRAPES) IMPLANT
DRAPE UTILITY XL STRL (DRAPES) ×8 IMPLANT
DRESSING TELFA 8X10 (GAUZE/BANDAGES/DRESSINGS) ×4 IMPLANT
DRSG TELFA 3X8 NADH (GAUZE/BANDAGES/DRESSINGS) ×4 IMPLANT
ELECT CAUTERY BLADE 6.4 (BLADE) ×4 IMPLANT
ELECT REM PT RETURN 9FT ADLT (ELECTROSURGICAL) ×4
ELECTRODE REM PT RTRN 9FT ADLT (ELECTROSURGICAL) ×2 IMPLANT
GAUZE SPONGE 2X2 8PLY STRL LF (GAUZE/BANDAGES/DRESSINGS) ×2 IMPLANT
GAUZE SPONGE 4X4 12PLY STRL (GAUZE/BANDAGES/DRESSINGS) ×4 IMPLANT
GAUZE SPONGE 4X4 16PLY XRAY LF (GAUZE/BANDAGES/DRESSINGS) ×8 IMPLANT
GLOVE BIO SURGEON STRL SZ7.5 (GLOVE) ×4 IMPLANT
GLOVE BIOGEL PI IND STRL 8 (GLOVE) ×2 IMPLANT
GLOVE BIOGEL PI INDICATOR 8 (GLOVE) ×2
GOWN STRL REUS W/ TWL LRG LVL3 (GOWN DISPOSABLE) ×2 IMPLANT
GOWN STRL REUS W/ TWL XL LVL3 (GOWN DISPOSABLE) ×2 IMPLANT
GOWN STRL REUS W/TWL LRG LVL3 (GOWN DISPOSABLE) ×2
GOWN STRL REUS W/TWL XL LVL3 (GOWN DISPOSABLE) ×2
KIT BASIN OR (CUSTOM PROCEDURE TRAY) ×4 IMPLANT
KIT ROOM TURNOVER OR (KITS) ×4 IMPLANT
NEEDLE HYPO 30X.5 LL (NEEDLE) ×4 IMPLANT
NS IRRIG 1000ML POUR BTL (IV SOLUTION) ×4 IMPLANT
PACK GENERAL/GYN (CUSTOM PROCEDURE TRAY) ×4 IMPLANT
PAD ARMBOARD 7.5X6 YLW CONV (MISCELLANEOUS) ×8 IMPLANT
PENCIL BUTTON HOLSTER BLD 10FT (ELECTRODE) ×4 IMPLANT
SPONGE GAUZE 2X2 STER 10/PKG (GAUZE/BANDAGES/DRESSINGS) ×2
STAPLER VISISTAT 35W (STAPLE) ×4 IMPLANT
SUT MNCRL AB 3-0 PS2 18 (SUTURE) ×4 IMPLANT
SUT MNCRL AB 4-0 PS2 18 (SUTURE) ×4 IMPLANT
SUT PROLENE 3 0 PS 2 (SUTURE) ×4 IMPLANT
SUT PROLENE 6 0 CC (SUTURE) ×4 IMPLANT
SUT VIC AB 3-0 SH 18 (SUTURE) ×4 IMPLANT
TAPE CLOTH SURG 4X10 WHT LF (GAUZE/BANDAGES/DRESSINGS) ×4 IMPLANT
TOWEL OR 17X24 6PK STRL BLUE (TOWEL DISPOSABLE) ×4 IMPLANT
TOWEL OR 17X26 10 PK STRL BLUE (TOWEL DISPOSABLE) ×4 IMPLANT
WATER STERILE IRR 1000ML POUR (IV SOLUTION) IMPLANT

## 2015-11-28 NOTE — Anesthesia Preprocedure Evaluation (Addendum)
Anesthesia Evaluation  Patient identified by MRN, date of birth, ID band Patient awake    Reviewed: Allergy & Precautions, H&P , NPO status , Patient's Chart, lab work & pertinent test results  History of Anesthesia Complications Negative for: history of anesthetic complications  Airway Mallampati: II  TM Distance: >3 FB Neck ROM: full    Dental no notable dental hx.    Pulmonary former smoker,    Pulmonary exam normal breath sounds clear to auscultation       Cardiovascular negative cardio ROS Normal cardiovascular exam Rhythm:regular Rate:Normal     Neuro/Psych  Headaches, PSYCHIATRIC DISORDERS Anxiety Depression    GI/Hepatic negative GI ROS, Neg liver ROS,   Endo/Other  Hypothyroidism   Renal/GU negative Renal ROS     Musculoskeletal   Abdominal   Peds  Hematology negative hematology ROS (+)   Anesthesia Other Findings Has headache this am  Reproductive/Obstetrics negative OB ROS                            Anesthesia Physical Anesthesia Plan  ASA: II  Anesthesia Plan: MAC   Post-op Pain Management:    Induction: Intravenous  Airway Management Planned: Nasal Cannula  Additional Equipment:   Intra-op Plan:   Post-operative Plan:   Informed Consent: I have reviewed the patients History and Physical, chart, labs and discussed the procedure including the risks, benefits and alternatives for the proposed anesthesia with the patient or authorized representative who has indicated his/her understanding and acceptance.     Plan Discussed with: Anesthesiologist, CRNA and Surgeon  Anesthesia Plan Comments: (Local only )       Anesthesia Quick Evaluation

## 2015-11-28 NOTE — Anesthesia Postprocedure Evaluation (Signed)
Anesthesia Post Note  Patient: Abigail Stevens  Procedure(s) Performed: Procedure(s) (LRB): COMPLEX WOUND CLOSER OF RIGHT CHEEK (Right) MELANOMA EXCISION (Right)  Patient location during evaluation: PACU Anesthesia Type: MAC Level of consciousness: awake, awake and alert and oriented Pain management: pain level controlled Vital Signs Assessment: post-procedure vital signs reviewed and stable Respiratory status: spontaneous breathing, nonlabored ventilation and respiratory function stable Cardiovascular status: blood pressure returned to baseline Anesthetic complications: no    Last Vitals:  Vitals:   11/28/15 1552 11/28/15 1600  BP: 124/85 (!) 155/90  Pulse: (!) 53 (!) 55  Resp: 16 13  Temp: 36.3 C     Last Pain:  Vitals:   11/28/15 1552  TempSrc:   PainSc: 0-No pain                 Dalisa Forrer COKER

## 2015-11-28 NOTE — Anesthesia Procedure Notes (Signed)
Procedure Name: MAC Date/Time: 11/28/2015 1:49 PM Performed by: Everlean Cherry A Pre-anesthesia Checklist: Patient identified, Emergency Drugs available, Suction available and Patient being monitored Patient Re-evaluated:Patient Re-evaluated prior to inductionOxygen Delivery Method: Nasal cannula Placement Confirmation: positive ETCO2

## 2015-11-28 NOTE — Brief Op Note (Signed)
11/28/2015  4:00 PM  PATIENT:  Abigail Stevens  60 y.o. 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   Delay start of Pharmacological VTE agent (>24hrs) due to surgical blood loss or risk of bleeding: not applicable

## 2015-11-28 NOTE — Transfer of Care (Signed)
Immediate Anesthesia Transfer of Care Note  Patient: Abigail Stevens  Procedure(s) Performed: Procedure(s): COMPLEX WOUND CLOSER OF RIGHT CHEEK (Right) MELANOMA EXCISION (Right)  Patient Location: PACU  Anesthesia Type:MAC  Level of Consciousness: awake, alert , oriented and patient cooperative  Airway & Oxygen Therapy: Patient Spontanous Breathing  Post-op Assessment: Report given to RN, Post -op Vital signs reviewed and stable and Patient moving all extremities X 4  Post vital signs: Reviewed and stable  Last Vitals:  Vitals:   11/28/15 1104  BP: 102/61  Pulse: (!) 59  Resp: 18  Temp: 36.7 C    Last Pain:  Vitals:   11/28/15 1243  TempSrc:   PainSc: 8          Complications: No apparent anesthesia complications

## 2015-11-28 NOTE — Discharge Instructions (Addendum)
Remove face dressing tomorrow. It is okay to reapply Vaseline to the incision but nothing else  It is okay to remove the back dressing on Saturday and shower if desired. A dressing should be reapplied afterwards  Return to office Wednesday. Call [phone] to set up appointment  Avoid vigorous activities. Do not even walk around the block  For questions call [phone] or [phone]

## 2015-11-28 NOTE — H&P (Signed)
I have re-examined and re-evaluated the patient and there are no changes.  See office notes in paper chart for H&P. 

## 2015-11-29 ENCOUNTER — Encounter (HOSPITAL_COMMUNITY): Payer: Self-pay | Admitting: Plastic Surgery

## 2015-11-29 NOTE — Op Note (Signed)
Abigail Stevens, Abigail Stevens NO.:  [id number]  Abigail RECORD NO.:  Stevens  LOCATION:  [facility]                         FACILITY:  [facility]  PHYSICIAN:  Abigail Stevens, M.D.     DATE OF BIRTH:  1956/01/24  DATE OF PROCEDURE:  11/28/2015 DATE OF DISCHARGE:  11/28/2015                              OPERATIVE REPORT   PREOPERATIVE DIAGNOSES: 1. Complicated open wound of the right cheek secondary to Mohs     surgery. 2. 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 3. Complicated open wound of the right back, 6 cm in length.  POSTOPERATIVE DIAGNOSES: 1. Complicated open wound of the right cheek secondary to Mohs     surgery. 2. 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 3. Complicated open wound of the right back, 6 cm in length.  PROCEDURE PERFORMED: 1. Transposition/advancement flap closure using an inferior nasolabial     cheek flap for closure of cheek wound. 2. Excision of melanoma, 1.8 cm. 3. Complex wound closure of the back as a distinct procedure, 6.0 cm.  SURGEON:  Abigail Stevens, M.D.  ANESTHESIA:  MAC with 1% Xylocaine with epinephrine plus Neut.  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  This resulted in a fairly large open wound of the right cheek.  Her Mohs surgeon felt that this could not be closed primarily and referred to Plastic Surgery.  She was seen in consultation the following day.  Her Mohs surgery having been done over somewhere near [place], [place].  The planned procedure was a transposition/advancement flap from the nasolabial fold for closure.  The nature of this procedure and risks were discussed with her in detail.  In addition, she had a melanoma of her back that had removed by her dermatologist and had positive margins.  This was melanoma in situ.  She wanted to go ahead with a wide excision of this as well to clear the margins.   The nature of these procedures and risks and possible complications were discussed with her in detail.  These risks include, but not limited to bleeding, infection, healing problems, scarring, loss of sensation, fluid accumulations, anesthesia-related complications, recurrence, further surgeries depending final pathology report, contour deformities, noticeable scarring, loss of tissue, loss of portions or all of the flap for closure of the cheek wound, and overall disappointment.  She understood all of this and wished to proceed.  DESCRIPTION OF PROCEDURE:  The patient was taken to the operating room and placed supine.  Under satisfactory sedation, she was prepped with Betadine for the right cheek wound and then draped with sterile drapes. Satisfied with anesthesia was achieved using 1% Xylocaine with epinephrine.  The wound was debrided, essentially saucerized, debriding the edges and then the base of the wound as well.  Thorough irrigation with saline, meticulous hemostasis with electrocautery.  Some undermining of the skin edges was performed and then the wound was checked, to be certain that a delayed primary closure was not possible. In fact, this did place some tension on the eyelid and the plan  then proceeded to the planned advancement/transposition flap.  The planned incision was carried over to the nasolabial fold and then down the fold. A Burow's wedge triangle was designed just inferomedial to this at the lower portion of the planned incision for the flap.  The flap was incised.  The flap was elevated.  The Burow's wedge was excised. Meticulous hemostasis with electrocautery.  Thorough irrigation with saline, excellent hemostasis was confirmed.  The flap had excellent color and bright red bleeding at its periphery, consistent with viability.  The flap was advanced and inset with 4-0 Monocryl interrupted inverted deep dermal sutures and running 6-0 Prolene simple sutures.   Flap had excellent color at the conclusion.  Vaseline, dry sterile dressing were applied.  Attention was then directed to the back.  The patient was placed in the left lateral decubitus position.  She had identified the site.  The wider excision of this area was then marked and the elliptical excision was then marked as well.  Margins of at least a 0.5-cm around the original site were marked.  Local anesthesia was achieved, 1% Xylocaine with epinephrine and the excision was performed.  Thorough irrigation with saline and excellent hemostasis was confirmed.  The specimen was tagged at its inferior border with a 3-0 Prolene suture.  After hemostasis having been confirmed, the layered closure with 3-0 Monocryl interrupted inverted deep sutures, 3-0 Monocryl interrupted inverted deep dermal sutures and 3-0 Prolene simple interrupted sutures. Vaseline and dry sterile dressing applied and she was transferred to the recovery room in stable having tolerated the procedure well.  DISPOSITION:  She will follow up in the office next week.     Abigail Stevens, M.D.     DB/MEDQ  D:  11/28/2015  T:  11/29/2015  Job:  [id number]

## 2015-12-04 ENCOUNTER — Encounter: Payer: Self-pay | Admitting: Nurse Practitioner

## 2015-12-04 ENCOUNTER — Ambulatory Visit (INDEPENDENT_AMBULATORY_CARE_PROVIDER_SITE_OTHER): Payer: 59 | Admitting: Nurse Practitioner

## 2015-12-04 VITALS — BP 103/65 | HR 58 | Ht 62.5 in | Wt 110.2 lb

## 2015-12-04 DIAGNOSIS — IMO0002 Reserved for concepts with insufficient information to code with codable children: Secondary | ICD-10-CM

## 2015-12-04 DIAGNOSIS — G43709 Chronic migraine without aura, not intractable, without status migrainosus: Secondary | ICD-10-CM

## 2015-12-04 MED ORDER — RIZATRIPTAN BENZOATE 10 MG PO TBDP: 10.0000 mg | ORAL_TABLET | ORAL | 6 refills | Status: DC | PRN

## 2015-12-04 MED ORDER — ONDANSETRON 4 MG PO TBDP: 4.0000 mg | ORAL_TABLET | Freq: Three times a day (TID) | ORAL | 6 refills | Status: DC | PRN

## 2015-12-04 MED ORDER — SUMATRIPTAN SUCCINATE REFILL 6 MG/0.5ML ~~LOC~~ SOCT: 6.0000 mg | SUBCUTANEOUS | 1 refills | Status: DC | PRN

## 2015-12-04 NOTE — Patient Instructions (Addendum)
Continue nortriptyline 25 mg 2 tablets every night Imitrex injection as needed will refill Maxalt dissolvable,10mg  will refill  Zofran dissolvable tablets as needed, will refill F/U in 8 months

## 2015-12-04 NOTE — Progress Notes (Signed)
[facility]  PATIENT: Abigail Stevens DOB: 08-31-1955   REASON FOR VISIT: Follow-up for migraine HISTORY FROM: Patient    HISTORY OF PRESENT ILLNESS:Abigail Stevens is a 60 years old right-handed female, seen in refer by her primary care physician from [facility] Dr. Jani Gravel in November 01 2014 for evaluation of frequent headaches  She reported a history of migraine headaches since 67 years old, her father also suffered typical migraines, her typical migraines are lateralized severe pounding headache was associated light noise sensitivity, nauseous, lasting for a few hours. She has gone through a lot of stress since 2015, complains of increased headache over the past 2 years, for a while, she has been taking frequent ibuprofen, Tylenol, without helping her symptoms, now she has tapered off daily analgesic use, continue complains frequent almost daily variable degree of headaches. She was not able to identify any clear triggers, she also woke up with severe right side retro-orbital area pounding headaches, spreading from right frontal parietal to right neck and shoulder region, 8 out of 10, she has been taking Imitrex tablet, which helps her some, but often she has to take second dose, she used upper 9 tablet monthly supply within 2 weeks,  Over the years, she has tried different preventive medications, butterbur, Inderal 60 mg twice a day, which seems to help her some, but the benefit does not sustain, she also has tried acupuncture, massage, chiropractor, ice pack, magnesium oxide, neck stretching exercise, ergonomic approach with limited help.  She is now using frequent ice pack, Imitrex 100 mg tablets as needed, Relpax works better, but it cost 150 $ for 4 tablets  UPDATE Dec 03 2014:YYShe is taking Nortriptyline 10mg  po qhs, her headache has much improved, she still gets average one headache every week, imitrex 100mg  as needed has been helpful,  she did not get the chance to try Imitrex injection  UPDATE April 25th 2017: YYHer headache overall has much improved, she is able to tolerating nortriptyline 10 mg 2 tablets every night, and also taking Inderal 60 mg daily, she has average 2-3 mild headaches each week, use Imitrex tablets, about once or twice each months she get more severe headache requiring Imitrex injection, which has been helpful, she also has significant nausea or vomiting with her severe migraine headaches, her headache often started at the left neck, spreading forward to become retro-orbital area headache, I also suggested thermalcare heating pad  UPDATE 10/25/2017CM Ms. Abigail Stevens, 60 year old female returns for follow-up. She has a long history of migraines which are in good control at present. She has about 4 migraines or less per month. Her migraines are currently well controlled on nortriptyline 50 mg at bedtime Zofran when necessary for nausea, she continues to take Inderal LA 60 mg daily, Maxalt acutely and sumatriptan injections if necessary. She is aware of her migraine triggers and tries to avoid those foods. She had a recent facial reconstruction for melanoma. She returns for reevaluation  REVIEW OF SYSTEMS: Full 14 system review of systems performed and notable only for those listed, all others are neg:  Constitutional: neg  Cardiovascular: neg Ear/Nose/Throat: neg  Skin: Facial wound Eyes: neg Respiratory: neg Gastroitestinal: neg  Hematology/Lymphatic: neg  Endocrine: neg Musculoskeletal:neg Allergy/Immunology: neg Neurological: History of migraines Psychiatric: neg Sleep : neg   ALLERGIES: Allergies  Allergen Reactions  . Codeine Nausea And Vomiting  . No Known Allergies     HOME MEDICATIONS: Outpatient Medications Prior to Visit  Medication Sig  Dispense Refill  . clonazePAM (KLONOPIN) 0.5 MG tablet Take 0.25 mg by mouth at bedtime as needed for anxiety.     . conjugated estrogens (PREMARIN)  vaginal cream Place 1 Applicatorful vaginally 2 (two) times a week.     . escitalopram (LEXAPRO) 20 MG tablet Take 10 mg by mouth daily.     . fluticasone (FLONASE) 50 MCG/ACT nasal spray Place 1 spray into both nostrils daily.     Marland Kitchen levothyroxine (SYNTHROID, LEVOTHROID) 50 MCG tablet Take 50 mcg by mouth See admin instructions. Taking every other day. Alternates days takes 50 mcg 1 day and 75 mcg the next    . levothyroxine (SYNTHROID, LEVOTHROID) 75 MCG tablet Take 75 mcg by mouth See admin instructions. Taking every other day. Alternates days takes 50 mcg 1 day and 75 mcg the next    . nortriptyline (PAMELOR) 25 MG capsule Take 2 capsules (50 mg total) by mouth at bedtime. 60 capsule 11  . Omega-3 Fatty Acids (FISH OIL) 1000 MG CAPS Take 1,000 mg by mouth daily. Has stopped prior to procedure    . ondansetron (ZOFRAN ODT) 4 MG disintegrating tablet Take 1 tablet (4 mg total) by mouth every 8 (eight) hours as needed for nausea or vomiting. 20 tablet 6  . propranolol ER (INDERAL LA) 60 MG 24 hr capsule Take 60 mg by mouth at bedtime.   2  . rizatriptan (MAXALT-MLT) 10 MG disintegrating tablet Take 1 tablet (10 mg total) by mouth as needed. May repeat in 2 hours if needed 15 tablet 6  . SUMAtriptan Succinate Refill (IMITREX STATDOSE REFILL) 6 MG/0.5ML SOCT Inject 6 mg into the skin as needed. (Patient taking differently: Inject 6 mg into the skin as needed (miagranes). ) 5 mL 6   No facility-administered medications prior to visit.     PAST MEDICAL HISTORY: Past Medical History:  Diagnosis Date  . Anxiety   . Cancer ([place])    basal cell carcinoma  right cheek and melanoma on back  . Depression   . Hypothyroidism   . Insomnia   . Migraine   . Neck pain   . Shoulder pain     PAST SURGICAL HISTORY: Past Surgical History:  Procedure Laterality Date  . APPENDECTOMY    . CESAREAN SECTION    . COLONOSCOPY    . DEBRIDEMENT AND CLOSURE WOUND Right 11/28/2015   Procedure: COMPLEX WOUND  CLOSER OF RIGHT CHEEK;  Surgeon: Crissie Reese, MD;  Location: [facility];  Service: Plastics;  Laterality: Right;  . DILATION AND CURETTAGE OF UTERUS    . MELANOMA EXCISION Right 11/28/2015   Procedure: MELANOMA EXCISION;  Surgeon: Crissie Reese, MD;  Location: [facility];  Service: Plastics;  Laterality: Right;  . TONSILLECTOMY    . TUBAL LIGATION    . WISDOM TOOTH EXTRACTION      FAMILY HISTORY: Family History  Problem Relation Age of Onset  . Aneurysm Mother   . Dementia Mother   . Heart disease Father   . Kidney disease Father   . Diabetes Father   . Migraines Father     SOCIAL HISTORY: Social History   Social History  . Marital status: Single    Spouse name: N/A  . Number of children: 2  . Years of education: 16   Occupational History  . HR Consultant    Social History Main Topics  . Smoking status: Former Smoker    Types: Cigarettes  . Smokeless tobacco: Never Used  Comment: Quit January 2007  . Alcohol use 0.0 oz/week     Comment: One drink per day and 2 drinks per day on the weekends.  . Drug use: No  . Sexual activity: Not on file   Other Topics Concern  . Not on file   Social History Narrative   Lives at home with son and daughter.   Right-handed.   1-2 cups caffeine per day.     PHYSICAL EXAM  Vitals:   12/04/15 0846  Height: 5' 2.5" (1.588 m)   There is no height or weight on file to calculate BMI.  Generalized: Well developed, in no acute distress  Head: normocephalic and atraumatic,. Oropharynx benign  Neck: Supple, no carotid bruits  Cardiac: Regular rate rhythm, no murmur  Musculoskeletal: No deformity   Neurological examination   Mentation: Alert oriented to time, place, history taking. Attention span and concentration appropriate. Recent and remote memory intact.  Follows all commands speech and language fluent.   Cranial nerve II-XII: Pupils were equal round reactive to light extraocular movements were full, visual field were full on  confrontational test. Facial sensation and strength were normal. hearing was intact to finger rubbing bilaterally. Uvula tongue midline. head turning and shoulder shrug were normal and symmetric.Tongue protrusion into cheek strength was normal. Motor: normal bulk and tone, full strength in the BUE, BLE, fine finger movements normal, no pronator drift. No focal weakness Sensory: normal and symmetric to light touch, pinprick, and  Vibration, proprioception  Coordination: finger-nose-finger, heel-to-shin bilaterally, no dysmetria Reflexes: Brachioradialis 2/2, biceps 2/2, triceps 2/2, patellar 2/2, Achilles 2/2, plantar responses were flexor bilaterally. Gait and Station: Rising up from seated position without assistance, normal stance,  moderate stride, good arm swing, smooth turning, able to perform tiptoe, and heel walking without difficulty. Tandem gait is steady  DIAGNOSTIC DATA (LABS, IMAGING, TESTING) - I reviewed patient records, labs, notes, testing and imaging myself where available.  Lab Results  Component Value Date   WBC 5.9 11/28/2015   HGB 14.8 11/28/2015   HCT 43.8 11/28/2015   MCV 92.8 11/28/2015   PLT 247 11/28/2015      Component Value Date/Time   NA 139 11/28/2015 1249   K 3.8 11/28/2015 1249   CL 103 11/28/2015 1249   CO2 27 11/28/2015 1249   GLUCOSE 78 11/28/2015 1249   BUN 13 11/28/2015 1249   CREATININE 0.83 11/28/2015 1249   CALCIUM 9.8 11/28/2015 1249   GFRNONAA >60 11/28/2015 1249   GFRAA >60 11/28/2015 1249    ASSESSMENT AND PLAN  60 y.o. year old female  has a past medical history of Anxiety; Cancer ([place]); Depression; Migraine; Neck pain; and Shoulder pain. here To follow-up for her chronic migraine headaches which are in good control. She is on Lexapro for her depression.   PLAN: Continue nortriptyline 25 mg 2 tablets every night Imitrex injection as needed will refill Maxalt dissolvable,10mg  will refill  Zofran dissolvable tablets as needed, will  refill Call for increase in headaches F/U in 8 months Dennie Bible, [facility], [facility], [facility] [address], [place], [place] [address] [phone]

## 2015-12-09 NOTE — Progress Notes (Signed)
I have reviewed and agreed above plan. 

## 2016-03-27 DIAGNOSIS — Z1211 Encounter for screening for malignant neoplasm of colon: Secondary | ICD-10-CM | POA: Diagnosis not present

## 2016-04-13 DIAGNOSIS — Z1231 Encounter for screening mammogram for malignant neoplasm of breast: Secondary | ICD-10-CM | POA: Diagnosis not present

## 2016-04-13 DIAGNOSIS — Z01419 Encounter for gynecological examination (general) (routine) without abnormal findings: Secondary | ICD-10-CM | POA: Diagnosis not present

## 2016-04-13 DIAGNOSIS — Z6820 Body mass index (BMI) 20.0-20.9, adult: Secondary | ICD-10-CM | POA: Diagnosis not present

## 2016-06-11 ENCOUNTER — Other Ambulatory Visit: Payer: Self-pay | Admitting: Neurology

## 2016-06-11 DIAGNOSIS — L821 Other seborrheic keratosis: Secondary | ICD-10-CM | POA: Diagnosis not present

## 2016-06-11 DIAGNOSIS — D225 Melanocytic nevi of trunk: Secondary | ICD-10-CM | POA: Diagnosis not present

## 2016-06-11 DIAGNOSIS — L910 Hypertrophic scar: Secondary | ICD-10-CM | POA: Diagnosis not present

## 2016-06-11 DIAGNOSIS — Z85828 Personal history of other malignant neoplasm of skin: Secondary | ICD-10-CM | POA: Diagnosis not present

## 2016-08-03 ENCOUNTER — Ambulatory Visit: Payer: 59 | Admitting: Nurse Practitioner

## 2016-10-19 NOTE — Progress Notes (Signed)
[facility]  PATIENT: Abigail Stevens DOB: [birth year]   REASON FOR VISIT: Follow-up for migraine HISTORY FROM: Patient    HISTORY OF PRESENT ILLNESS:Abigail Stevens is a 61 years old right-handed female, seen in refer by her primary care physician from [place] [facility] Dr. Jani Gravel in November 01 2014 for evaluation of frequent headaches  She reported a history of migraine headaches since 38 years old, her father also suffered typical migraines, her typical migraines are lateralized severe pounding headache was associated light noise sensitivity, nauseous, lasting for a few hours. She has gone through a lot of stress since 2015, complains of increased headache over the past 2 years, for a while, she has been taking frequent ibuprofen, Tylenol, without helping her symptoms, now she has tapered off daily analgesic use, continue complains frequent almost daily variable degree of headaches. She was not able to identify any clear triggers, she also woke up with severe right side retro-orbital area pounding headaches, spreading from right frontal parietal to right neck and shoulder region, 8 out of 10, she has been taking Imitrex tablet, which helps her some, but often she has to take second dose, she used upper 9 tablet monthly supply within 2 weeks,  Over the years, she has tried different preventive medications, butterbur, Inderal 60 mg twice a day, which seems to help her some, but the benefit does not sustain, she also has tried acupuncture, massage, chiropractor, ice pack, magnesium oxide, neck stretching exercise, ergonomic approach with limited help.  She is now using frequent ice pack, Imitrex 100 mg tablets as needed, Relpax works better, but it cost 150 $ for 4 tablets  UPDATE Dec 03 2014:YYShe is taking Nortriptyline 10mg  po qhs, her headache has much improved, she still gets average one headache every week, imitrex 100mg  as needed has been helpful,  she did not get the chance to try Imitrex injection  UPDATE April 25th 2017: YYHer headache overall has much improved, she is able to tolerating nortriptyline 10 mg 2 tablets every night, and also taking Inderal 60 mg daily, she has average 2-3 mild headaches each week, use Imitrex tablets, about once or twice each months she get more severe headache requiring Imitrex injection, which has been helpful, she also has significant nausea or vomiting with her severe migraine headaches, her headache often started at the left neck, spreading forward to become retro-orbital area headache, I also suggested thermalcare heating pad  UPDATE 10/25/2017CM Ms. Abigail Stevens, 61 year old female returns for follow-up. She has a long history of migraines which are in good control at present. She has about 4 migraines or less per month. Her migraines are currently well controlled on nortriptyline 50 mg at bedtime Zofran when necessary for nausea, she continues to take Inderal LA 60 mg daily, Maxalt acutely and sumatriptan injections if necessary. She is aware of her migraine triggers and tries to avoid those foods. She had a recent facial reconstruction for melanoma. She returns for reevaluation UPDATE 10/20/2016 CM Ms. Abigail Stevens, 61 year old female follow-up was history of migraines. Her migraines are currently well controlled on a combination of Pamelor Inderal in acute medications of Maxalt and sumatriptan injections if needed. She is aware of her migraine triggers and tries to avoid those foods. She has Zofran to take for nausea if needed. She has had no interval medical issues. She returns for reevaluation REVIEW OF SYSTEMS: Full 14 system review of systems performed and notable only for those listed, all others are neg:  Constitutional:  neg  Cardiovascular: neg Ear/Nose/Throat: neg  Skin: Facial wound Eyes: Light sensitivity Respiratory: neg Gastroitestinal: neg  Hematology/Lymphatic: neg  Endocrine: Intolerance to heat and  cold Musculoskeletal: Neck pain Allergy/Immunology: neg Neurological: History of migraines Psychiatric: neg Sleep : neg   ALLERGIES: Allergies  Allergen Reactions  . Codeine Nausea And Vomiting  . No Known Allergies     HOME MEDICATIONS: Outpatient Medications Prior to Visit  Medication Sig Dispense Refill  . conjugated estrogens (PREMARIN) vaginal cream Place 1 Applicatorful vaginally 2 (two) times a week.     . levothyroxine (SYNTHROID, LEVOTHROID) 50 MCG tablet Take 50 mcg by mouth See admin instructions. Taking every other day. Alternates days takes 50 mcg 1 day and 75 mcg the next    . levothyroxine (SYNTHROID, LEVOTHROID) 75 MCG tablet Take 75 mcg by mouth See admin instructions. Taking every other day. Alternates days takes 50 mcg 1 day and 75 mcg the next    . nortriptyline (PAMELOR) 25 MG capsule TAKE 2 CAPSULES (50 MG TOTAL) BY MOUTH AT BEDTIME. 60 capsule 8  . Omega-3 Fatty Acids (FISH OIL) 1000 MG CAPS Take 1,000 mg by mouth daily. Has stopped prior to procedure    . ondansetron (ZOFRAN ODT) 4 MG disintegrating tablet Take 1 tablet (4 mg total) by mouth every 8 (eight) hours as needed for nausea or vomiting. 20 tablet 6  . propranolol ER (INDERAL LA) 60 MG 24 hr capsule Take 60 mg by mouth at bedtime.   2  . rizatriptan (MAXALT-MLT) 10 MG disintegrating tablet Take 1 tablet (10 mg total) by mouth as needed. May repeat in 2 hours if needed 15 tablet 6  . SUMAtriptan Succinate Refill (IMITREX STATDOSE REFILL) 6 MG/0.5ML SOCT Inject 6 mg into the skin as needed. 5 mL 1  . clonazePAM (KLONOPIN) 0.5 MG tablet Take 0.25 mg by mouth at bedtime as needed for anxiety.     . fluticasone (FLONASE) 50 MCG/ACT nasal spray Place 1 spray into both nostrils daily.     Marland Kitchen escitalopram (LEXAPRO) 20 MG tablet Take 10 mg by mouth daily.      No facility-administered medications prior to visit.     PAST MEDICAL HISTORY: Past Medical History:  Diagnosis Date  . Anxiety   . Cancer ([place])      basal cell carcinoma  right cheek and melanoma on back  . Depression   . Hypothyroidism   . Insomnia   . Migraine   . Neck pain   . Shoulder pain     PAST SURGICAL HISTORY: Past Surgical History:  Procedure Laterality Date  . APPENDECTOMY    . CESAREAN SECTION    . COLONOSCOPY    . DEBRIDEMENT AND CLOSURE WOUND Right 11/28/2015   Procedure: COMPLEX WOUND CLOSER OF RIGHT CHEEK;  Surgeon: Crissie Reese, MD;  Location: [place];  Service: Plastics;  Laterality: Right;  . DILATION AND CURETTAGE OF UTERUS    . MELANOMA EXCISION Right 11/28/2015   Procedure: MELANOMA EXCISION;  Surgeon: Crissie Reese, MD;  Location: [place];  Service: Plastics;  Laterality: Right;  . TONSILLECTOMY    . TUBAL LIGATION    . WISDOM TOOTH EXTRACTION      FAMILY HISTORY: Family History  Problem Relation Age of Onset  . Aneurysm Mother   . Dementia Mother   . Heart disease Father   . Kidney disease Father   . Diabetes Father   . Migraines Father     SOCIAL HISTORY: Social History  Social History  . Marital status: Single    Spouse name: N/A  . Number of children: 2  . Years of education: 16   Occupational History  . HR Consultant    Social History Main Topics  . Smoking status: Former Smoker    Types: Cigarettes  . Smokeless tobacco: Never Used     Comment: Quit January 2007  . Alcohol use 0.0 oz/week     Comment: One drink per day and 2 drinks per day on the weekends.  . Drug use: No  . Sexual activity: Not on file   Other Topics Concern  . Not on file   Social History Narrative   Lives at home with son and daughter.   Right-handed.   1-2 cups caffeine per day.     PHYSICAL EXAM  Vitals:   10/20/16 0858  BP: 101/66  Pulse: 64  Weight: 115 lb 12.8 oz (52.5 kg)   Body mass index is 20.84 kg/m.  Generalized: Well developed, in no acute distress  Head: normocephalic and atraumatic,. Oropharynx benign  Neck: Supple,  Musculoskeletal: No deformity   Neurological  examination   Mentation: Alert oriented to time, place, history taking. Attention span and concentration appropriate. Recent and remote memory intact.  Follows all commands speech and language fluent.   Cranial nerve II-XII: Pupils were equal round reactive to light extraocular movements were full, visual field were full on confrontational test. Facial sensation and strength were normal. hearing was intact to finger rubbing bilaterally. Uvula tongue midline. head turning and shoulder shrug were normal and symmetric.Tongue protrusion into cheek strength was normal. Motor: normal bulk and tone, full strength in the BUE, BLE, fine finger movements normal, no pronator drift. No focal weakness Sensory: normal and symmetric to light touch, pinprick, and  Vibration, proprioception  Coordination: finger-nose-finger, heel-to-shin bilaterally, no dysmetria Reflexes: Brachioradialis 2/2, biceps 2/2, triceps 2/2, patellar 2/2, Achilles 2/2, plantar responses were flexor bilaterally. Gait and Station: Rising up from seated position without assistance, normal stance,  moderate stride, good arm swing, smooth turning, able to perform tiptoe, and heel walking without difficulty. Tandem gait is steady  DIAGNOSTIC DATA (LABS, IMAGING, TESTING) - I reviewed patient records, labs, notes, testing and imaging myself where available.  Lab Results  Component Value Date   WBC 5.9 11/28/2015   HGB 14.8 11/28/2015   HCT 43.8 11/28/2015   MCV 92.8 11/28/2015   PLT 247 11/28/2015      Component Value Date/Time   NA 139 11/28/2015 1249   K 3.8 11/28/2015 1249   CL 103 11/28/2015 1249   CO2 27 11/28/2015 1249   GLUCOSE 78 11/28/2015 1249   BUN 13 11/28/2015 1249   CREATININE 0.83 11/28/2015 1249   CALCIUM 9.8 11/28/2015 1249   GFRNONAA >60 11/28/2015 1249   GFRAA >60 11/28/2015 1249    ASSESSMENT AND PLAN  61 y.o. year old female  has a past medical history of Anxiety; Depression; Migraine; Neck pain; and  Shoulder pain. here To follow-up for her chronic migraine headaches which are in good control. She is on Lexapro for her depression.   PLAN: Continue nortriptyline 25 mg 2 tablets every night Imitrex injection as needed will refill Maxalt dissolvable,10mg  will refill  Zofran dissolvable tablets as needed, will refill Call for increase in headaches F/U in 1 year Dennie Bible, [facility], [facility], [facility] [address] [place], [place] [address] [phone]

## 2016-10-20 ENCOUNTER — Encounter: Payer: Self-pay | Admitting: Nurse Practitioner

## 2016-10-20 ENCOUNTER — Ambulatory Visit (INDEPENDENT_AMBULATORY_CARE_PROVIDER_SITE_OTHER): Payer: 59 | Admitting: Nurse Practitioner

## 2016-10-20 VITALS — BP 101/66 | HR 64 | Wt 115.8 lb

## 2016-10-20 DIAGNOSIS — G43709 Chronic migraine without aura, not intractable, without status migrainosus: Secondary | ICD-10-CM | POA: Diagnosis not present

## 2016-10-20 DIAGNOSIS — IMO0002 Reserved for concepts with insufficient information to code with codable children: Secondary | ICD-10-CM

## 2016-10-20 MED ORDER — RIZATRIPTAN BENZOATE 10 MG PO TBDP: 10.0000 mg | ORAL_TABLET | ORAL | 6 refills | Status: DC | PRN

## 2016-10-20 MED ORDER — ONDANSETRON 4 MG PO TBDP: 4.0000 mg | ORAL_TABLET | Freq: Three times a day (TID) | ORAL | 6 refills | Status: DC | PRN

## 2016-10-20 MED ORDER — SUMATRIPTAN SUCCINATE REFILL 6 MG/0.5ML ~~LOC~~ SOCT: 6.0000 mg | SUBCUTANEOUS | 1 refills | Status: DC | PRN

## 2016-10-20 MED ORDER — NORTRIPTYLINE HCL 25 MG PO CAPS: 50.0000 mg | ORAL_CAPSULE | Freq: Every day | ORAL | 11 refills | Status: DC

## 2016-10-20 NOTE — Patient Instructions (Signed)
Continue nortriptyline 25 mg 2 tablets every night Imitrex injection as needed will refill Maxalt dissolvable,10mg  will refill  Zofran dissolvable tablets as needed, will refill Call for increase in headaches F/U in 1 year

## 2016-10-26 NOTE — Progress Notes (Signed)
I have reviewed and agreed above plan. 

## 2016-12-01 DIAGNOSIS — Z23 Encounter for immunization: Secondary | ICD-10-CM | POA: Diagnosis not present

## 2016-12-15 DIAGNOSIS — L814 Other melanin hyperpigmentation: Secondary | ICD-10-CM | POA: Diagnosis not present

## 2016-12-15 DIAGNOSIS — D225 Melanocytic nevi of trunk: Secondary | ICD-10-CM | POA: Diagnosis not present

## 2016-12-15 DIAGNOSIS — Z85820 Personal history of malignant melanoma of skin: Secondary | ICD-10-CM | POA: Diagnosis not present

## 2016-12-15 DIAGNOSIS — L910 Hypertrophic scar: Secondary | ICD-10-CM | POA: Diagnosis not present

## 2017-01-27 DIAGNOSIS — E7800 Pure hypercholesterolemia, unspecified: Secondary | ICD-10-CM | POA: Diagnosis not present

## 2017-01-27 DIAGNOSIS — Z0000 Encounter for general adult medical examination without abnormal findings: Secondary | ICD-10-CM | POA: Diagnosis not present

## 2017-03-05 DIAGNOSIS — J0100 Acute maxillary sinusitis, unspecified: Secondary | ICD-10-CM | POA: Diagnosis not present

## 2017-03-11 ENCOUNTER — Other Ambulatory Visit: Payer: Self-pay | Admitting: Nurse Practitioner

## 2017-03-12 ENCOUNTER — Other Ambulatory Visit: Payer: Self-pay

## 2017-03-12 MED ORDER — RIZATRIPTAN BENZOATE 10 MG PO TBDP: 10.0000 mg | ORAL_TABLET | ORAL | 1 refills | Status: DC | PRN

## 2017-03-16 DIAGNOSIS — J019 Acute sinusitis, unspecified: Secondary | ICD-10-CM | POA: Diagnosis not present

## 2017-03-23 ENCOUNTER — Telehealth: Payer: Self-pay | Admitting: Neurology

## 2017-03-23 NOTE — Telephone Encounter (Signed)
Pt calling PCP wants provider to take over all of her migraine medications. He was prescribing Sumatriptan Succinate 100mg  #9. Pt said she does not need a refill until next month. Please call to discuss.

## 2017-03-23 NOTE — Telephone Encounter (Signed)
I spoke to pt and she is taking rizatriptan 10mg  odt prn, sumatriptan inj 6mg  prn, and has been given as well sumatriptan 100mg  tabs prn.  She does not take these together.  Depending on her level of pain, this would determine what she would choose.  Are you ok to prescribe all of these?  I told her that normally only one oral triptan is prescribed at at time.  Please advise.

## 2017-03-24 MED ORDER — SUMATRIPTAN SUCCINATE 100 MG PO TABS: 100.0000 mg | ORAL_TABLET | Freq: Once | ORAL | 2 refills | Status: DC | PRN

## 2017-03-24 NOTE — Telephone Encounter (Signed)
I would not give her Imitrex tabs, it is ok to give her Maxalt and Imitrex injection as needed.

## 2017-03-24 NOTE — Telephone Encounter (Signed)
Pt had been made aware if this was not possible would call her back.  I called [facility] and cancelled maxalt refills.  Placed new prescription for imtrex 100mg  po tabs.

## 2017-03-24 NOTE — Telephone Encounter (Signed)
It is Ok

## 2017-03-24 NOTE — Telephone Encounter (Signed)
Spoke to pt and relayed per Dr. Krista Blue would use maxalt and imitrex inj and not imitrex tabs.  She would like to use the imitrex tabs 100mg   Vs the maxalt would this be ok?

## 2017-03-24 NOTE — Addendum Note (Signed)
Addended by: Brandon Melnick on: 03/24/2017 04:41 PM   Modules accepted: Orders

## 2017-03-30 DIAGNOSIS — Z23 Encounter for immunization: Secondary | ICD-10-CM | POA: Diagnosis not present

## 2017-04-22 ENCOUNTER — Telehealth: Payer: Self-pay | Admitting: *Deleted

## 2017-04-22 MED ORDER — NORTRIPTYLINE HCL 25 MG PO CAPS: 50.0000 mg | ORAL_CAPSULE | Freq: Every day | ORAL | 2 refills | Status: DC

## 2017-04-22 NOTE — Telephone Encounter (Signed)
Received fax from [facility] for 90 day supply of nortriptyline. Will e scribe for enough medication until FU in Sept.

## 2017-06-15 DIAGNOSIS — L821 Other seborrheic keratosis: Secondary | ICD-10-CM | POA: Diagnosis not present

## 2017-06-15 DIAGNOSIS — D225 Melanocytic nevi of trunk: Secondary | ICD-10-CM | POA: Diagnosis not present

## 2017-06-15 DIAGNOSIS — L814 Other melanin hyperpigmentation: Secondary | ICD-10-CM | POA: Diagnosis not present

## 2017-06-29 ENCOUNTER — Telehealth: Payer: Self-pay | Admitting: Nurse Practitioner

## 2017-06-29 MED ORDER — SUMATRIPTAN SUCCINATE 100 MG PO TABS: 100.0000 mg | ORAL_TABLET | Freq: Once | ORAL | 2 refills | Status: DC | PRN

## 2017-06-29 NOTE — Telephone Encounter (Signed)
Pt requesting refills for SUMAtriptan (IMITREX) 100 MG tablet sent to [facility].

## 2017-08-26 DIAGNOSIS — Z6820 Body mass index (BMI) 20.0-20.9, adult: Secondary | ICD-10-CM | POA: Diagnosis not present

## 2017-08-26 DIAGNOSIS — Z01419 Encounter for gynecological examination (general) (routine) without abnormal findings: Secondary | ICD-10-CM | POA: Diagnosis not present

## 2017-10-19 NOTE — Progress Notes (Signed)
[facility]  PATIENT: Abigail Stevens DOB: 1955-05-29   REASON FOR VISIT: Follow-up for migraine HISTORY FROM: Patient    HISTORY OF PRESENT ILLNESS:Abigail Stevens is a 62 years old right-handed female, seen in refer by her primary care physician from [place] [facility] Dr. Jani Gravel in November 01 2014 for evaluation of frequent headaches  She reported a history of migraine headaches since 65 years old, her father also suffered typical migraines, her typical migraines are lateralized severe pounding headache was associated light noise sensitivity, nauseous, lasting for a few hours. She has gone through a lot of stress since 2015, complains of increased headache over the past 2 years, for a while, she has been taking frequent ibuprofen, Tylenol, without helping her symptoms, now she has tapered off daily analgesic use, continue complains frequent almost daily variable degree of headaches. She was not able to identify any clear triggers, she also woke up with severe right side retro-orbital area pounding headaches, spreading from right frontal parietal to right neck and shoulder region, 8 out of 10, she has been taking Imitrex tablet, which helps her some, but often she has to take second dose, she used upper 9 tablet monthly supply within 2 weeks,  Over the years, she has tried different preventive medications, butterbur, Inderal 60 mg twice a day, which seems to help her some, but the benefit does not sustain, she also has tried acupuncture, massage, chiropractor, ice pack, magnesium oxide, neck stretching exercise, ergonomic approach with limited help.  She is now using frequent ice pack, Imitrex 100 mg tablets as needed, Relpax works better, but it cost 150 $ for 4 tablets  UPDATE Dec 03 2014:YYShe is taking Nortriptyline 10mg  po qhs, her headache has much improved, she still gets average one headache every week, imitrex 100mg  as needed has been helpful,  she did not get the chance to try Imitrex injection  UPDATE April 25th 2017: YYHer headache overall has much improved, she is able to tolerating nortriptyline 10 mg 2 tablets every night, and also taking Inderal 60 mg daily, she has average 2-3 mild headaches each week, use Imitrex tablets, about once or twice each months she get more severe headache requiring Imitrex injection, which has been helpful, she also has significant nausea or vomiting with her severe migraine headaches, her headache often started at the left neck, spreading forward to become retro-orbital area headache, I also suggested thermalcare heating pad  UPDATE 10/25/2017CM Ms. Abigail Stevens, 62 year old female returns for follow-up. She has a long history of migraines which are in good control at present. She has about 4 migraines or less per month. Her migraines are currently well controlled on nortriptyline 50 mg at bedtime Zofran when necessary for nausea, she continues to take Inderal LA 60 mg daily, Maxalt acutely and sumatriptan injections if necessary. She is aware of her migraine triggers and tries to avoid those foods. She had a recent facial reconstruction for melanoma. She returns for reevaluation UPDATE 10/20/2016 CM Ms. Abigail Stevens, 62 year old female follow-up was history of migraines. Her migraines are currently well controlled on a combination of Pamelor Inderal in acute medications of Maxalt and sumatriptan injections if needed. She is aware of her migraine triggers and tries to avoid those foods. She has Zofran to take for nausea if needed. She has had no interval medical issues. She returns for reevaluation UPDATE 9/11/2019CM Ms. Abigail Stevens, 62 year old female returns for follow-up with history of migraine headaches.  She is currently doing well on a  combination of Pamelor and Inderal and acute medication sumatriptan both orally and injection.  She uses Zofran if needed.  Her migraine triggers are weather changes and red wine.  She also  has some foods that cause problems which she tries to avoid.  She returns for reevaluation.  No interval medical issues REVIEW OF SYSTEMS: Full 14 system review of systems performed and notable only for those listed, all others are neg:  Constitutional: neg  Cardiovascular: Palpitations Ear/Nose/Throat: neg  Skin: neg Eyes: Light sensitivity Respiratory: neg Gastroitestinal: neg  Hematology/Lymphatic: neg  Endocrine: Intolerance to heat and cold Musculoskeletal: Neck pain Allergy/Immunology: neg Neurological: History of migraines Psychiatric: neg Sleep : neg   ALLERGIES: Allergies  Allergen Reactions  . Codeine Nausea And Vomiting    HOME MEDICATIONS: Outpatient Medications Prior to Visit  Medication Sig Dispense Refill  . ALPRAZolam (XANAX) 0.25 MG tablet as needed.    . clonazePAM (KLONOPIN) 0.5 MG tablet Take 0.25 mg by mouth at bedtime as needed for anxiety.     . conjugated estrogens (PREMARIN) vaginal cream Place 1 Applicatorful vaginally 2 (two) times a week.     . escitalopram (LEXAPRO) 10 MG tablet 10 mg daily.    Marland Kitchen levothyroxine (SYNTHROID, LEVOTHROID) 50 MCG tablet Take 50 mcg by mouth See admin instructions. Taking every other day. Alternates days takes 50 mcg 1 day and 75 mcg the next    . levothyroxine (SYNTHROID, LEVOTHROID) 75 MCG tablet Take 75 mcg by mouth See admin instructions. Taking every other day. Alternates days takes 50 mcg 1 day and 75 mcg the next    . nortriptyline (PAMELOR) 25 MG capsule Take 2 capsules (50 mg total) by mouth at bedtime. 180 capsule 2  . ondansetron (ZOFRAN ODT) 4 MG disintegrating tablet Take 1 tablet (4 mg total) by mouth every 8 (eight) hours as needed for nausea or vomiting. 20 tablet 6  . propranolol ER (INDERAL LA) 60 MG 24 hr capsule Take 60 mg by mouth at bedtime.   2  . SUMAtriptan (IMITREX) 100 MG tablet Take 1 tablet (100 mg total) by mouth once as needed for up to 1 dose for migraine. May repeat in 2 hours if headache  persists or recurs. 9 tablet 2  . SUMAtriptan Succinate Refill (IMITREX STATDOSE REFILL) 6 MG/0.5ML SOCT Inject 6 mg into the skin as needed. 5 mL 1  . fluticasone (FLONASE) 50 MCG/ACT nasal spray Place 1 spray into both nostrils daily.     . Omega-3 Fatty Acids (FISH OIL) 1000 MG CAPS Take 1,000 mg by mouth daily. Has stopped prior to procedure     No facility-administered medications prior to visit.     PAST MEDICAL HISTORY: Past Medical History:  Diagnosis Date  . Anxiety   . Cancer ([place])    basal cell carcinoma  right cheek and melanoma on back  . Depression   . Hypothyroidism   . Insomnia   . Migraine   . Neck pain   . Shoulder pain     PAST SURGICAL HISTORY: Past Surgical History:  Procedure Laterality Date  . APPENDECTOMY    . CESAREAN SECTION    . COLONOSCOPY    . DEBRIDEMENT AND CLOSURE WOUND Right 11/28/2015   Procedure: COMPLEX WOUND CLOSER OF RIGHT CHEEK;  Surgeon: Crissie Reese, MD;  Location: [place];  Service: Plastics;  Laterality: Right;  . DILATION AND CURETTAGE OF UTERUS    . MELANOMA EXCISION Right 11/28/2015   Procedure: MELANOMA EXCISION;  Surgeon: Shanon Brow  Harlow Mares, MD;  Location: [place];  Service: Plastics;  Laterality: Right;  . TONSILLECTOMY    . TUBAL LIGATION    . WISDOM TOOTH EXTRACTION      FAMILY HISTORY: Family History  Problem Relation Age of Onset  . Aneurysm Mother   . Dementia Mother   . Heart disease Father   . Kidney disease Father   . Diabetes Father   . Migraines Father     SOCIAL HISTORY: Social History   Socioeconomic History  . Marital status: Single    Spouse name: Not on file  . Number of children: 2  . Years of education: 65  . Highest education level: Not on file  Occupational History  . Occupation: HR Consultant  Social Needs  . Financial resource strain: Not on file  . Food insecurity:    Worry: Not on file    Inability: Not on file  . Transportation needs:    Medical: Not on file    Non-medical: Not on file    Tobacco Use  . Smoking status: Former Smoker    Types: Cigarettes  . Smokeless tobacco: Never Used  . Tobacco comment: Quit January 2007  Substance and Sexual Activity  . Alcohol use: Yes    Alcohol/week: 0.0 standard drinks    Comment: One drink per day and 2 drinks per day on the weekends.  . Drug use: No  . Sexual activity: Not on file  Lifestyle  . Physical activity:    Days per week: Not on file    Minutes per session: Not on file  . Stress: Not on file  Relationships  . Social connections:    Talks on phone: Not on file    Gets together: Not on file    Attends religious service: Not on file    Active member of club or organization: Not on file    Attends meetings of clubs or organizations: Not on file    Relationship status: Not on file  . Intimate partner violence:    Fear of current or ex partner: Not on file    Emotionally abused: Not on file    Physically abused: Not on file    Forced sexual activity: Not on file  Other Topics Concern  . Not on file  Social History Narrative   Lives at home with son and daughter.   Right-handed.   1-2 cups caffeine per day.     PHYSICAL EXAM  Vitals:   10/20/17 0826  BP: 99/61  Pulse: 63  Weight: 116 lb 12.8 oz (53 kg)  Height: 5\' 3"  (1.6 m)   Body mass index is 20.69 kg/m.  Generalized: Well developed, in no acute distress  Head: normocephalic and atraumatic,. Oropharynx benign  Neck: Supple,  Musculoskeletal: No deformity   Neurological examination   Mentation: Alert oriented to time, place, history taking. Attention span and concentration appropriate. Recent and remote memory intact.  Follows all commands speech and language fluent.   Cranial nerve II-XII: Pupils were equal round reactive to light extraocular movements were full, visual field were full on confrontational test. Facial sensation and strength were normal. hearing was intact to finger rubbing bilaterally. Uvula tongue midline. head turning and  shoulder shrug were normal and symmetric.Tongue protrusion into cheek strength was normal. Motor: normal bulk and tone, full strength in the BUE, BLE, fine finger movements normal, no pronator drift. No focal weakness Sensory: normal and symmetric to light touch, in the upper and lower extremities Coordination:  finger-nose-finger, heel-to-shin bilaterally, no dysmetria Reflexes: Symmetric upper and lower, plantar responses were flexor bilaterally. Gait and Station: Rising up from seated position without assistance, normal stance,  moderate stride, good arm swing, smooth turning, able to perform tiptoe, and heel walking without difficulty. Tandem gait is steady  DIAGNOSTIC DATA (LABS, IMAGING, TESTING) - I reviewed patient records, labs, notes, testing and imaging myself where available.  Lab Results  Component Value Date   WBC 5.9 11/28/2015   HGB 14.8 11/28/2015   HCT 43.8 11/28/2015   MCV 92.8 11/28/2015   PLT 247 11/28/2015      Component Value Date/Time   NA 139 11/28/2015 1249   K 3.8 11/28/2015 1249   CL 103 11/28/2015 1249   CO2 27 11/28/2015 1249   GLUCOSE 78 11/28/2015 1249   BUN 13 11/28/2015 1249   CREATININE 0.83 11/28/2015 1249   CALCIUM 9.8 11/28/2015 1249   GFRNONAA >60 11/28/2015 1249   GFRAA >60 11/28/2015 1249    ASSESSMENT AND PLAN  62 y.o. year old female  has a past medical history of Anxiety; Depression; Migraine; Neck pain; and Shoulder pain. here To follow-up for her chronic migraine headaches which are in good control. She is on Lexapro for her depression.   PLAN: Continue nortriptyline 25 mg 2 tablets every night will refill Continue propanolol 60 mg at bedtime we will refill Imitrex injection as needed will refill Continue Imitrex 100mg  orally as needed, will refill  Zofran dissolvable tablets as needed,  Call for increase in headaches, migraine tracker APP Stay well hydrated 8 to 10(8 oz) daily  F/U in 1 year Dennie Bible, [facility], [facility],  [facility] [address] [place], [place] [address] [phone]

## 2017-10-20 ENCOUNTER — Encounter: Payer: Self-pay | Admitting: Nurse Practitioner

## 2017-10-20 ENCOUNTER — Ambulatory Visit (INDEPENDENT_AMBULATORY_CARE_PROVIDER_SITE_OTHER): Payer: 59 | Admitting: Nurse Practitioner

## 2017-10-20 VITALS — BP 99/61 | HR 63 | Ht 63.0 in | Wt 116.8 lb

## 2017-10-20 DIAGNOSIS — IMO0002 Reserved for concepts with insufficient information to code with codable children: Secondary | ICD-10-CM

## 2017-10-20 DIAGNOSIS — G43709 Chronic migraine without aura, not intractable, without status migrainosus: Secondary | ICD-10-CM | POA: Diagnosis not present

## 2017-10-20 MED ORDER — SUMATRIPTAN SUCCINATE REFILL 6 MG/0.5ML ~~LOC~~ SOCT: 6.0000 mg | SUBCUTANEOUS | 2 refills | Status: DC | PRN

## 2017-10-20 MED ORDER — NORTRIPTYLINE HCL 25 MG PO CAPS: 50.0000 mg | ORAL_CAPSULE | Freq: Every day | ORAL | 3 refills | Status: DC

## 2017-10-20 MED ORDER — SUMATRIPTAN SUCCINATE 100 MG PO TABS: 100.0000 mg | ORAL_TABLET | Freq: Once | ORAL | 11 refills | Status: DC | PRN

## 2017-10-20 MED ORDER — PROPRANOLOL HCL ER 60 MG PO CP24: 60.0000 mg | ORAL_CAPSULE | Freq: Every day | ORAL | 3 refills | Status: DC

## 2017-10-20 NOTE — Patient Instructions (Addendum)
Continue nortriptyline 25 mg 2 tablets every night will refill Continue propanolol 60 mg at bedtime Imitrex injection as needed will refill Continue Imitrex 100mg  orally as needed, will refill  Zofran dissolvable tablets as needed, Call for increase in headaches, migraine tracker APP Stay well hydrated  F/U in 1 year

## 2017-10-21 NOTE — Progress Notes (Signed)
I have reviewed and agreed above plan. 

## 2017-11-30 DIAGNOSIS — Z23 Encounter for immunization: Secondary | ICD-10-CM | POA: Diagnosis not present

## 2017-12-16 DIAGNOSIS — D225 Melanocytic nevi of trunk: Secondary | ICD-10-CM | POA: Diagnosis not present

## 2017-12-16 DIAGNOSIS — D485 Neoplasm of uncertain behavior of skin: Secondary | ICD-10-CM | POA: Diagnosis not present

## 2017-12-16 DIAGNOSIS — L570 Actinic keratosis: Secondary | ICD-10-CM | POA: Diagnosis not present

## 2017-12-16 DIAGNOSIS — L821 Other seborrheic keratosis: Secondary | ICD-10-CM | POA: Diagnosis not present

## 2018-05-06 DIAGNOSIS — Z0000 Encounter for general adult medical examination without abnormal findings: Secondary | ICD-10-CM | POA: Diagnosis not present

## 2018-05-06 DIAGNOSIS — E7800 Pure hypercholesterolemia, unspecified: Secondary | ICD-10-CM | POA: Diagnosis not present

## 2018-06-14 DIAGNOSIS — L570 Actinic keratosis: Secondary | ICD-10-CM | POA: Diagnosis not present

## 2018-06-14 DIAGNOSIS — L821 Other seborrheic keratosis: Secondary | ICD-10-CM | POA: Diagnosis not present

## 2018-06-14 DIAGNOSIS — D225 Melanocytic nevi of trunk: Secondary | ICD-10-CM | POA: Diagnosis not present

## 2018-06-14 DIAGNOSIS — D1801 Hemangioma of skin and subcutaneous tissue: Secondary | ICD-10-CM | POA: Diagnosis not present

## 2018-10-26 ENCOUNTER — Other Ambulatory Visit: Payer: Self-pay

## 2018-10-26 ENCOUNTER — Ambulatory Visit (INDEPENDENT_AMBULATORY_CARE_PROVIDER_SITE_OTHER): Payer: 59 | Admitting: Neurology

## 2018-10-26 ENCOUNTER — Encounter: Payer: Self-pay | Admitting: Neurology

## 2018-10-26 ENCOUNTER — Other Ambulatory Visit: Payer: Self-pay | Admitting: *Deleted

## 2018-10-26 VITALS — BP 131/86 | HR 63 | Temp 97.1°F | Ht 63.0 in | Wt 111.0 lb

## 2018-10-26 DIAGNOSIS — G43709 Chronic migraine without aura, not intractable, without status migrainosus: Secondary | ICD-10-CM | POA: Diagnosis not present

## 2018-10-26 DIAGNOSIS — IMO0002 Reserved for concepts with insufficient information to code with codable children: Secondary | ICD-10-CM

## 2018-10-26 DIAGNOSIS — R413 Other amnesia: Secondary | ICD-10-CM | POA: Diagnosis not present

## 2018-10-26 MED ORDER — NORTRIPTYLINE HCL 25 MG PO CAPS: 50.0000 mg | ORAL_CAPSULE | Freq: Every day | ORAL | 3 refills | Status: DC

## 2018-10-26 MED ORDER — PROPRANOLOL HCL ER 60 MG PO CP24: 60.0000 mg | ORAL_CAPSULE | Freq: Every day | ORAL | 3 refills | Status: DC

## 2018-10-26 MED ORDER — SUMATRIPTAN SUCCINATE 100 MG PO TABS: 100.0000 mg | ORAL_TABLET | Freq: Once | ORAL | 11 refills | Status: DC | PRN

## 2018-10-26 MED ORDER — PROMETHAZINE HCL 25 MG PO TABS: 25.0000 mg | ORAL_TABLET | Freq: Four times a day (QID) | ORAL | 5 refills | Status: DC | PRN

## 2018-10-26 NOTE — Progress Notes (Signed)
[facility]  PATIENT: Abigail Stevens DOB: [birth year]    HISTORY OF PRESENT ILLNESS:Abigail Stevens is a 63 years old right-handed female, seen in refer by her primary care physician from [facility] Dr. Jani Gravel in November 01 2014 for evaluation of frequent headaches  She reported a history of migraine headaches since 54 years old, her father also suffered typical migraines, her typical migraines are lateralized severe pounding headache was associated light noise sensitivity, nauseous, lasting for a few hours. She has gone through a lot of stress since 2015, complains of increased headache over the past 2 years, for a while, she has been taking frequent ibuprofen, Tylenol, without helping her symptoms, now she has tapered off daily analgesic use, continue complains frequent almost daily variable degree of headaches. She was not able to identify any clear triggers, she also woke up with severe right side retro-orbital area pounding headaches, spreading from right frontal parietal to right neck and shoulder region, 8 out of 10, she has been taking Imitrex tablet, which helps her some, but often she has to take second dose, she used upper 9 tablet monthly supply within 2 weeks,  Over the years, she has tried different preventive medications, butterbur, Inderal 60 mg twice a day, which seems to help her some, but the benefit does not sustain, she also has tried acupuncture, massage, chiropractor, ice pack, magnesium oxide, neck stretching exercise, ergonomic approach with limited help.  She is now using frequent ice pack, Imitrex 100 mg tablets as needed, Relpax works better, but it cost 150 $ for 4 tablets  UPDATE Dec 03 2014:YYShe is taking Nortriptyline 10mg  po qhs, her headache has much improved, she still gets average one headache every week, imitrex 100mg  as needed has been helpful, she did not get the chance to try Imitrex injection  UPDATE April  25th 2017: YYHer headache overall has much improved, she is able to tolerating nortriptyline 10 mg 2 tablets every night, and also taking Inderal 60 mg daily, she has average 2-3 mild headaches each week, use Imitrex tablets, about once or twice each months she get more severe headache requiring Imitrex injection, which has been helpful, she also has significant nausea or vomiting with her severe migraine headaches, her headache often started at the left neck, spreading forward to become retro-orbital area headache, I also suggested thermalcare heating pad.  UPDATE Sept 16 2020: Her migraine is overall under good control, she has migraine about every couple months, responding well to Imitrex, trigger for her migraines are bright light, exertion, wines, she has taking nortriptyline, 25 mg 2 tablets every night for preventive medication, propanolol LA 60 mg at bedtime, Lexapro 10 mg daily  Her mother suffered dementia in her 20s, she now works as an Freight forwarder, noticed mild memory loss, tends to repeat herself.  REVIEW OF SYSTEMS: Full 14 system review of systems performed and notable only for those listed, all others are neg:  As above  ALLERGIES: Allergies  Allergen Reactions  . Codeine Nausea And Vomiting    HOME MEDICATIONS: Outpatient Medications Prior to Visit  Medication Sig Dispense Refill  . ALPRAZolam (XANAX) 0.25 MG tablet as needed.    . conjugated estrogens (PREMARIN) vaginal cream Place 1 Applicatorful vaginally 2 (two) times a week.     . escitalopram (LEXAPRO) 10 MG tablet 10 mg daily.    . fluticasone (FLONASE) 50 MCG/ACT nasal spray Place 1 spray into both nostrils daily.     Marland Kitchen  levothyroxine (SYNTHROID, LEVOTHROID) 50 MCG tablet Take 50 mcg by mouth See admin instructions. Taking every other day. Alternates days takes 50 mcg 1 day and 75 mcg the next    . levothyroxine (SYNTHROID, LEVOTHROID) 75 MCG tablet Take 75 mcg by mouth See admin instructions. Taking every other  day. Alternates days takes 50 mcg 1 day and 75 mcg the next    . nortriptyline (PAMELOR) 25 MG capsule Take 2 capsules (50 mg total) by mouth at bedtime. 180 capsule 3  . propranolol ER (INDERAL LA) 60 MG 24 hr capsule Take 1 capsule (60 mg total) by mouth at bedtime. 90 capsule 3  . SUMAtriptan (IMITREX) 100 MG tablet Take 1 tablet (100 mg total) by mouth once as needed for up to 1 dose for migraine. May repeat in 2 hours if headache persists or recurs. 9 tablet 11  . SUMAtriptan Succinate Refill (IMITREX STATDOSE REFILL) 6 MG/0.5ML SOCT Inject 6 mg into the skin as needed. 5 mL 2  . clonazePAM (KLONOPIN) 0.5 MG tablet Take 0.25 mg by mouth at bedtime as needed for anxiety.     . Omega-3 Fatty Acids (FISH OIL) 1000 MG CAPS Take 1,000 mg by mouth daily. Has stopped prior to procedure    . ondansetron (ZOFRAN ODT) 4 MG disintegrating tablet Take 1 tablet (4 mg total) by mouth every 8 (eight) hours as needed for nausea or vomiting. 20 tablet 6   No facility-administered medications prior to visit.     PAST MEDICAL HISTORY: Past Medical History:  Diagnosis Date  . Anxiety   . Cancer ([place])    basal cell carcinoma  right cheek and melanoma on back  . Depression   . Hypothyroidism   . Insomnia   . Migraine   . Neck pain   . Shoulder pain     PAST SURGICAL HISTORY: Past Surgical History:  Procedure Laterality Date  . APPENDECTOMY    . CESAREAN SECTION    . COLONOSCOPY    . DEBRIDEMENT AND CLOSURE WOUND Right 11/28/2015   Procedure: COMPLEX WOUND CLOSER OF RIGHT CHEEK;  Surgeon: Crissie Reese, MD;  Location: [facility];  Service: Plastics;  Laterality: Right;  . DILATION AND CURETTAGE OF UTERUS    . MELANOMA EXCISION Right 11/28/2015   Procedure: MELANOMA EXCISION;  Surgeon: Crissie Reese, MD;  Location: [facility];  Service: Plastics;  Laterality: Right;  . TONSILLECTOMY    . TUBAL LIGATION    . WISDOM TOOTH EXTRACTION      FAMILY HISTORY: Family History  Problem Relation Age of Onset  .  Aneurysm Mother   . Dementia Mother   . Heart disease Father   . Kidney disease Father   . Diabetes Father   . Migraines Father     SOCIAL HISTORY: Social History   Socioeconomic History  . Marital status: Single    Spouse name: Not on file  . Number of children: 2  . Years of education: 42  . Highest education level: Not on file  Occupational History  . Occupation: HR Consultant  Social Needs  . Financial resource strain: Not on file  . Food insecurity    Worry: Not on file    Inability: Not on file  . Transportation needs    Medical: Not on file    Non-medical: Not on file  Tobacco Use  . Smoking status: Former Smoker    Types: Cigarettes  . Smokeless tobacco: Never Used  . Tobacco comment: Quit January 2007  Substance and Sexual Activity  . Alcohol use: Yes    Alcohol/week: 0.0 standard drinks    Comment: One drink per day and 2 drinks per day on the weekends.  . Drug use: No  . Sexual activity: Not on file  Lifestyle  . Physical activity    Days per week: Not on file    Minutes per session: Not on file  . Stress: Not on file  Relationships  . Social Herbalist on phone: Not on file    Gets together: Not on file    Attends religious service: Not on file    Active member of club or organization: Not on file    Attends meetings of clubs or organizations: Not on file    Relationship status: Not on file  . Intimate partner violence    Fear of current or ex partner: Not on file    Emotionally abused: Not on file    Physically abused: Not on file    Forced sexual activity: Not on file  Other Topics Concern  . Not on file  Social History Narrative   Lives at home with son and daughter.   Right-handed.   1-2 cups caffeine per day.     PHYSICAL EXAM  Vitals:   10/26/18 0940  BP: 131/86  Pulse: 63  Temp: (!) 97.1 F (36.2 C)  Weight: 111 lb (50.3 kg)  Height: 5\' 3"  (1.6 m)   Body mass index is 19.66 kg/m.  Generalized: Well developed,  in no acute distress  Head: normocephalic and atraumatic,. Oropharynx benign  Neck: Supple,  Musculoskeletal: No deformity   Neurological examination   MMSE - Mini Mental State Exam 10/26/2018  Orientation to time 5  Orientation to Place 5  Registration 3  Attention/ Calculation 5  Recall 2  Language- name 2 objects 2  Language- repeat 1  Language- follow 3 step command 3  Language- read & follow direction 1  Write a sentence 1  Copy design 1  Total score 29  Animal naming 16  Cranial nerve II-XII: Pupils were equal round reactive to light extraocular movements were full, visual field were full on confrontational test. Facial sensation and strength were normal. hearing was intact to finger rubbing bilaterally. Uvula tongue midline. head turning and shoulder shrug were normal and symmetric.Tongue protrusion into cheek strength was normal. Motor: normal bulk and tone, full strength in the BUE, BLE, fine finger movements normal, no pronator drift. No focal weakness Sensory: normal and symmetric to light touch, in the upper and lower extremities Coordination: finger-nose-finger, heel-to-shin bilaterally, no dysmetria Reflexes: Symmetric upper and lower, plantar responses were flexor bilaterally. Gait and Station: Rising up from seated position without assistance, normal stance,  moderate stride, good arm swing, smooth turning, able to perform tiptoe, and heel walking without difficulty. Tandem gait is steady  DIAGNOSTIC DATA (LABS, IMAGING, TESTING) - I reviewed patient records, labs, notes, testing and imaging myself where available.  Lab Results  Component Value Date   WBC 5.9 11/28/2015   HGB 14.8 11/28/2015   HCT 43.8 11/28/2015   MCV 92.8 11/28/2015   PLT 247 11/28/2015      Component Value Date/Time   NA 139 11/28/2015 1249   K 3.8 11/28/2015 1249   CL 103 11/28/2015 1249   CO2 27 11/28/2015 1249   GLUCOSE 78 11/28/2015 1249   BUN 13 11/28/2015 1249   CREATININE 0.83  11/28/2015 1249   CALCIUM 9.8 11/28/2015 1249  GFRNONAA >60 11/28/2015 1249   GFRAA >60 11/28/2015 1249    ASSESSMENT AND PLAN  63 y.o. year old female   Mild cognitive impairment  Family history of dementia, complete evaluation with MRI of the brain, laboratory evaluation to rule out treatable etiology.  Chronic migraine headaches  Continue nortriptyline 25 mg 2 tablets every night, Inderal LA 60 mg as preventive medications  Imitrex 100 mg as needed   Marcial Pacas, M.D. Ph.D.  [facility] [place], [place] [address] Phone: [phone] Fax:      [phone]

## 2018-10-27 ENCOUNTER — Telehealth: Payer: Self-pay | Admitting: Neurology

## 2018-10-27 LAB — CBC WITH DIFFERENTIAL
Basophils Absolute: 0.1 10*3/uL (ref 0.0–0.2)
Basos: 1 %
EOS (ABSOLUTE): 0.1 10*3/uL (ref 0.0–0.4)
Eos: 2 %
Hematocrit: 37 % (ref 34.0–46.6)
Hemoglobin: 12.9 g/dL (ref 11.1–15.9)
Immature Grans (Abs): 0 10*3/uL (ref 0.0–0.1)
Immature Granulocytes: 0 %
Lymphocytes Absolute: 1.6 10*3/uL (ref 0.7–3.1)
Lymphs: 35 %
MCH: 30.8 pg (ref 26.6–33.0)
MCHC: 34.9 g/dL (ref 31.5–35.7)
MCV: 88 fL (ref 79–97)
Monocytes Absolute: 0.5 10*3/uL (ref 0.1–0.9)
Monocytes: 10 %
Neutrophils Absolute: 2.5 10*3/uL (ref 1.4–7.0)
Neutrophils: 52 %
RBC: 4.19 x10E6/uL (ref 3.77–5.28)
RDW: 11.6 % — ABNORMAL LOW (ref 11.7–15.4)
WBC: 4.8 10*3/uL (ref 3.4–10.8)

## 2018-10-27 LAB — TSH: TSH: 0.476 u[IU]/mL (ref 0.450–4.500)

## 2018-10-27 LAB — COMPREHENSIVE METABOLIC PANEL
ALT: 12 IU/L (ref 0–32)
AST: 29 IU/L (ref 0–40)
Albumin/Globulin Ratio: 2.2 (ref 1.2–2.2)
Albumin: 4.6 g/dL (ref 3.8–4.8)
Alkaline Phosphatase: 56 IU/L (ref 39–117)
BUN/Creatinine Ratio: 14 (ref 12–28)
BUN: 14 mg/dL (ref 8–27)
Bilirubin Total: 0.4 mg/dL (ref 0.0–1.2)
CO2: 25 mmol/L (ref 20–29)
Calcium: 9.4 mg/dL (ref 8.7–10.3)
Chloride: 101 mmol/L (ref 96–106)
Creatinine, Ser: 0.97 mg/dL (ref 0.57–1.00)
GFR calc Af Amer: 72 mL/min/{1.73_m2} (ref >59)
GFR calc non Af Amer: 63 mL/min/{1.73_m2} (ref >59)
Globulin, Total: 2.1 g/dL (ref 1.5–4.5)
Glucose: 94 mg/dL (ref 65–99)
Potassium: 5.2 mmol/L (ref 3.5–5.2)
Sodium: 140 mmol/L (ref 134–144)
Total Protein: 6.7 g/dL (ref 6.0–8.5)

## 2018-10-27 LAB — FOLATE: Folate: 14.2 ng/mL (ref >3.0)

## 2018-10-27 LAB — VITAMIN B12: Vitamin B-12: 230 pg/mL — ABNORMAL LOW (ref 232–1245)

## 2018-10-27 LAB — RPR: RPR Ser Ql: NONREACTIVE

## 2018-10-27 NOTE — Telephone Encounter (Signed)
Please call patient, lab evaluation showed low vit b12 230, she would benefit from vit B12 im supplement, 1066mcg daily for one week, weekly for one month and once every month

## 2018-10-27 NOTE — Telephone Encounter (Signed)
The patient is aware of her lab results.  She is going to follow up with her PCP office to discuss injections. Her lab results along with Dr. Rhea Belton recommended injection schedule has been faxed and confirmed to [facility] [place].  The patient is aware to expect a call from them.

## 2018-10-31 ENCOUNTER — Telehealth: Payer: Self-pay | Admitting: Neurology

## 2018-10-31 NOTE — Telephone Encounter (Signed)
no to the covid questions MR Brain wo contrast Dr. Krista Blue [facility] Auth: [place] via [facility] website. Patient is scheduled at [facility] for 06/02/18.

## 2018-11-02 ENCOUNTER — Ambulatory Visit: Payer: 59

## 2018-11-02 ENCOUNTER — Other Ambulatory Visit: Payer: Self-pay

## 2018-11-02 DIAGNOSIS — R413 Other amnesia: Secondary | ICD-10-CM | POA: Diagnosis not present

## 2018-11-08 ENCOUNTER — Telehealth: Payer: Self-pay | Admitting: Neurology

## 2018-11-08 NOTE — Telephone Encounter (Signed)
I spoke to the patient and reviewed the information below with her.  She verbalized understanding and appreciated the return phone call.

## 2018-11-08 NOTE — Telephone Encounter (Signed)
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 What does this mean? Is this a precursor for dementia? Does it increase my risk for developing dementia?  Please call patient, MRI of the brain showed no significant abnormality, there is some nonspecific white matter hyperintensities, radiology usually used to term small vessel disease, to describe the potential mechanism of the hyperdensity changes, that was related to decreased small vessel irrigation to the brain area.  It was only minimum changes, this can be age-related changes, no clinical significance.  She will have a better understanding if she can reviewed the film at next follow-up visit.   FINDINGS:  The brain parenchyma shows tiny periventricular and subcortical nonspecific white matter hyperintensities from small vessel disease.  No other structural lesion, tumor or infarcts are noted. No abnormal lesions are seen on diffusion-weighted views to suggest acute ischemia. The cortical sulci, fissures and cisterns are normal in size and appearance. Lateral, third and fourth ventricle are normal in size and appearance. No extra-axial fluid collections are seen. No evidence of mass effect or midline shift.  On sagittal views the posterior fossa, pituitary gland and corpus callosum are unremarkable. No evidence of intracranial hemorrhage on gradient-echo views. The orbits and their contents, paranasal sinuses and calvarium are unremarkable.  Intracranial flow voids are present but posterior circulation flow voids appear to be of diminutive caliber.   IMPRESSION: Unremarkable MRI scan of the brain without contrast.

## 2018-12-28 ENCOUNTER — Other Ambulatory Visit: Payer: Self-pay | Admitting: Plastic Surgery

## 2019-02-21 DIAGNOSIS — D1801 Hemangioma of skin and subcutaneous tissue: Secondary | ICD-10-CM | POA: Diagnosis not present

## 2019-02-21 DIAGNOSIS — D485 Neoplasm of uncertain behavior of skin: Secondary | ICD-10-CM | POA: Diagnosis not present

## 2019-02-21 DIAGNOSIS — L821 Other seborrheic keratosis: Secondary | ICD-10-CM | POA: Diagnosis not present

## 2019-02-21 DIAGNOSIS — L905 Scar conditions and fibrosis of skin: Secondary | ICD-10-CM | POA: Diagnosis not present

## 2019-02-21 DIAGNOSIS — Z85828 Personal history of other malignant neoplasm of skin: Secondary | ICD-10-CM | POA: Diagnosis not present

## 2019-02-21 DIAGNOSIS — L570 Actinic keratosis: Secondary | ICD-10-CM | POA: Diagnosis not present

## 2019-02-21 DIAGNOSIS — L814 Other melanin hyperpigmentation: Secondary | ICD-10-CM | POA: Diagnosis not present

## 2019-02-22 DIAGNOSIS — E538 Deficiency of other specified B group vitamins: Secondary | ICD-10-CM | POA: Diagnosis not present

## 2019-02-23 DIAGNOSIS — C44722 Squamous cell carcinoma of skin of right lower limb, including hip: Secondary | ICD-10-CM | POA: Diagnosis not present

## 2019-03-13 DIAGNOSIS — E538 Deficiency of other specified B group vitamins: Secondary | ICD-10-CM | POA: Diagnosis not present

## 2019-03-17 DIAGNOSIS — Z20828 Contact with and (suspected) exposure to other viral communicable diseases: Secondary | ICD-10-CM | POA: Diagnosis not present

## 2019-03-31 DIAGNOSIS — Z03818 Encounter for observation for suspected exposure to other biological agents ruled out: Secondary | ICD-10-CM | POA: Diagnosis not present

## 2019-03-31 DIAGNOSIS — Z20828 Contact with and (suspected) exposure to other viral communicable diseases: Secondary | ICD-10-CM | POA: Diagnosis not present

## 2019-04-10 DIAGNOSIS — E538 Deficiency of other specified B group vitamins: Secondary | ICD-10-CM | POA: Diagnosis not present

## 2019-04-24 DIAGNOSIS — C44529 Squamous cell carcinoma of skin of other part of trunk: Secondary | ICD-10-CM | POA: Diagnosis not present

## 2019-04-24 DIAGNOSIS — D485 Neoplasm of uncertain behavior of skin: Secondary | ICD-10-CM | POA: Diagnosis not present

## 2019-04-24 DIAGNOSIS — L570 Actinic keratosis: Secondary | ICD-10-CM | POA: Diagnosis not present

## 2019-04-26 ENCOUNTER — Encounter: Payer: Self-pay | Admitting: Neurology

## 2019-04-26 ENCOUNTER — Ambulatory Visit (INDEPENDENT_AMBULATORY_CARE_PROVIDER_SITE_OTHER): Payer: BC Managed Care – PPO | Admitting: Neurology

## 2019-04-26 ENCOUNTER — Other Ambulatory Visit: Payer: Self-pay

## 2019-04-26 VITALS — BP 97/68 | HR 65 | Temp 97.4°F | Ht 63.0 in | Wt 111.5 lb

## 2019-04-26 DIAGNOSIS — G43709 Chronic migraine without aura, not intractable, without status migrainosus: Secondary | ICD-10-CM

## 2019-04-26 DIAGNOSIS — IMO0002 Reserved for concepts with insufficient information to code with codable children: Secondary | ICD-10-CM

## 2019-04-26 DIAGNOSIS — R413 Other amnesia: Secondary | ICD-10-CM | POA: Diagnosis not present

## 2019-04-26 MED ORDER — NORTRIPTYLINE HCL 25 MG PO CAPS: 50.0000 mg | ORAL_CAPSULE | Freq: Every day | ORAL | 3 refills | Status: DC

## 2019-04-26 MED ORDER — PROPRANOLOL HCL ER 60 MG PO CP24: 60.0000 mg | ORAL_CAPSULE | Freq: Every day | ORAL | 3 refills | Status: DC

## 2019-04-26 MED ORDER — SUMATRIPTAN SUCCINATE 100 MG PO TABS: 100.0000 mg | ORAL_TABLET | Freq: Once | ORAL | 11 refills | Status: DC | PRN

## 2019-04-26 NOTE — Progress Notes (Signed)
PATIENT: Abigail Stevens DOB: May 07, 1955  REASON FOR VISIT: follow up HISTORY FROM: patient  HISTORY OF PRESENT ILLNESS: Today 04/26/19  HISTORY  HISTORY OF PRESENT ILLNESS:Abigail C Allenis a 64 years old right-handed female, seen in refer by her primary care physician from [facility] Dr. Jani Gravel in November 01 2014 for evaluation of frequent headaches  She reported a history of migraine headaches since 75 years old, her father also suffered typical migraines, her typical migraines are lateralized severe pounding headache was associated light noise sensitivity, nauseous, lasting for a few hours. She has gone through a lot of stress since 2015, complains of increased headache over the past 2 years, for a while, she has been taking frequent ibuprofen, Tylenol, without helping her symptoms, now she has tapered off daily analgesic use, continue complains frequent almost daily variable degree of headaches. She was not able to identify any clear triggers, she also woke up with severe right side retro-orbital area pounding headaches, spreading from right frontal parietal to right neck and shoulder region, 8 out of 10, she has been taking Imitrex tablet, which helps her some, but often she has to take second dose, she used upper 9 tablet monthly supply within 2 weeks,  Over the years, she has tried different preventive medications, butterbur, Inderal 60 mg twice a day, which seems to help her some, but the benefit does not sustain, she also has tried acupuncture, massage, chiropractor, ice pack, magnesium oxide, neck stretching exercise, ergonomic approach with limited help.  She is now using frequent ice pack, Imitrex 100 mg tablets as needed, Relpax works better, but it cost 150 $ for 4 tablets  UPDATE Dec 03 2014:YYShe is taking Nortriptyline 10mg  po qhs, her headache has much improved, she still gets average one headache every week, imitrex 100mg  as needed has been  helpful, she did not get the chance to try Imitrex injection  UPDATE April 25th 2017:YYHer headache overall has much improved, she is able to tolerating nortriptyline 10 mg 2 tablets every night, and also taking Inderal 60 mg daily, she has average 2-3 mild headaches each week, use Imitrex tablets, about once or twice each months she get more severe headache requiring Imitrex injection, which has been helpful, she also has significant nausea or vomiting with her severe migraine headaches, her headache often started at the left neck, spreading forward to become retro-orbital area headache, I also suggested thermalcare heating pad.  UPDATE Sept 16 2020: Her migraine is overall under good control, she has migraine about every couple months, responding well to Imitrex, trigger for her migraines are bright light, exertion, wines, she has taking nortriptyline, 25 mg 2 tablets every night for preventive medication, propanolol LA 60 mg at bedtime, Lexapro 10 mg daily  Her mother suffered dementia in her 62s, she now works as an Freight forwarder, noticed mild memory loss, tends to repeat herself.   Update April 26, 2019 SS: When last seen, reported mild memory loss, labs showed low B12 230.  MRI of the brain in September 2020 was unremarkable. Headaches are about the same, 4 times a month, will take Imitrex, use ice on her head, it will stop the migraines usually. Feels memory is stable. Has to write everything down, is HR consultant, does her job well. She is seeing PCP getting B12 shots from PCP, thinks it has helped memory. She keeps Imitrex injection if needed, none in the last year, Phenergan if needed.  REVIEW OF SYSTEMS: Out of  a complete 14 system review of symptoms, the patient complains only of the following symptoms, and all other reviewed systems are negative.  Headache  ALLERGIES: Allergies  Allergen Reactions  . Codeine Nausea And Vomiting  . White Petrolatum Itching    Blisters  skin Blisters skin     HOME MEDICATIONS: Outpatient Medications Prior to Visit  Medication Sig Dispense Refill  . ALPRAZolam (XANAX) 0.25 MG tablet as needed.    . conjugated estrogens (PREMARIN) vaginal cream Place 1 Applicatorful vaginally 2 (two) times a week.     . escitalopram (LEXAPRO) 10 MG tablet 10 mg daily.    . fluticasone (FLONASE) 50 MCG/ACT nasal spray Place 1 spray into both nostrils daily.     Marland Kitchen levothyroxine (SYNTHROID, LEVOTHROID) 75 MCG tablet Take 75 mcg by mouth See admin instructions. Taking 58mcg Mon-Fri, 37.5mg  Sat-Sun.    . promethazine (PHENERGAN) 25 MG tablet Take 1 tablet (25 mg total) by mouth every 6 (six) hours as needed for nausea or vomiting. #30 per month. 30 tablet 5  . SUMAtriptan Succinate Refill (IMITREX STATDOSE REFILL) 6 MG/0.5ML SOCT Inject 6 mg into the skin as needed. 5 mL 2  . nortriptyline (PAMELOR) 25 MG capsule Take 2 capsules (50 mg total) by mouth at bedtime. 180 capsule 3  . propranolol ER (INDERAL LA) 60 MG 24 hr capsule Take 1 capsule (60 mg total) by mouth at bedtime. 90 capsule 3  . SUMAtriptan (IMITREX) 100 MG tablet Take 1 tablet (100 mg total) by mouth once as needed for up to 1 dose for migraine. May repeat in 2 hours if headache persists or recurs. 9 tablet 11  . levothyroxine (SYNTHROID, LEVOTHROID) 50 MCG tablet Take 50 mcg by mouth See admin instructions. Taking every other day. Alternates days takes 50 mcg 1 day and 75 mcg the next     No facility-administered medications prior to visit.    PAST MEDICAL HISTORY: Past Medical History:  Diagnosis Date  . Anxiety   . Cancer ([place])    basal cell carcinoma  right cheek and melanoma on back  . Depression   . Hypothyroidism   . Insomnia   . Migraine   . Neck pain   . Shoulder pain     PAST SURGICAL HISTORY: Past Surgical History:  Procedure Laterality Date  . APPENDECTOMY    . CESAREAN SECTION    . COLONOSCOPY    . DEBRIDEMENT AND CLOSURE WOUND Right 11/28/2015    Procedure: COMPLEX WOUND CLOSER OF RIGHT CHEEK;  Surgeon: Crissie Reese, MD;  Location: [facility];  Service: Plastics;  Laterality: Right;  . DILATION AND CURETTAGE OF UTERUS    . MELANOMA EXCISION Right 11/28/2015   Procedure: MELANOMA EXCISION;  Surgeon: Crissie Reese, MD;  Location: [facility];  Service: Plastics;  Laterality: Right;  . TONSILLECTOMY    . TUBAL LIGATION    . WISDOM TOOTH EXTRACTION      FAMILY HISTORY: Family History  Problem Relation Age of Onset  . Aneurysm Mother   . Dementia Mother   . Heart disease Father   . Kidney disease Father   . Diabetes Father   . Migraines Father     SOCIAL HISTORY: Social History   Socioeconomic History  . Marital status: Single    Spouse name: Not on file  . Number of children: 2  . Years of education: 79  . Highest education level: Not on file  Occupational History  . Occupation: HR Consultant  Tobacco Use  .  Smoking status: Former Smoker    Types: Cigarettes  . Smokeless tobacco: Never Used  . Tobacco comment: Quit January 2007  Substance and Sexual Activity  . Alcohol use: Yes    Alcohol/week: 0.0 standard drinks    Comment: One drink per day and 2 drinks per day on the weekends.  . Drug use: No  . Sexual activity: Not on file  Other Topics Concern  . Not on file  Social History Narrative   Lives at home with son and daughter.   Right-handed.   1-2 cups caffeine per day.   Social Determinants of Health   Financial Resource Strain:   . Difficulty of Paying Living Expenses:   Food Insecurity:   . Worried About Charity fundraiser in the Last Year:   . Arboriculturist in the Last Year:   Transportation Needs:   . Film/video editor (Medical):   Marland Kitchen Lack of Transportation (Non-Medical):   Physical Activity:   . Days of Exercise per Week:   . Minutes of Exercise per Session:   Stress:   . Feeling of Stress :   Social Connections:   . Frequency of Communication with Friends and Family:   . Frequency of Social  Gatherings with Friends and Family:   . Attends Religious Services:   . Active Member of Clubs or Organizations:   . Attends Archivist Meetings:   Marland Kitchen Marital Status:   Intimate Partner Violence:   . Fear of Current or Ex-Partner:   . Emotionally Abused:   Marland Kitchen Physically Abused:   . Sexually Abused:    PHYSICAL EXAM  Vitals:   04/26/19 0912  BP: 97/68  Pulse: 65  Temp: (!) 97.4 F (36.3 C)  Weight: 111 lb 8 oz (50.6 kg)  Height: 5\' 3"  (1.6 m)   Body mass index is 19.75 kg/m.  Generalized: Well developed, in no acute distress  MMSE - Mini Mental State Exam 10/26/2018  Orientation to time 5  Orientation to Place 5  Registration 3  Attention/ Calculation 5  Recall 2  Language- name 2 objects 2  Language- repeat 1  Language- follow 3 step command 3  Language- read & follow direction 1  Write a sentence 1  Copy design 1  Total score 29     Neurological examination  Mentation: Alert oriented to time, place, history taking. Follows all commands speech and language fluent Cranial nerve II-XII: Pupils were equal round reactive to light. Extraocular movements were full, visual field were full on confrontational test. Facial sensation and strength were normal. Head turning and shoulder shrug were normal and symmetric. Motor: The motor testing reveals 5 over 5 strength of all 4 extremities. Good symmetric motor tone is noted throughout.  Sensory: Sensory testing is intact to soft touch on all 4 extremities. No evidence of extinction is noted.  Coordination: Cerebellar testing reveals good finger-nose-finger and heel-to-shin bilaterally.  Gait and station: Gait is normal. Tandem gait is normal. Romberg is negative. No drift is seen.  Reflexes: Deep tendon reflexes are symmetric and normal bilaterally.   DIAGNOSTIC DATA (LABS, IMAGING, TESTING) - I reviewed patient records, labs, notes, testing and imaging myself where available.  Lab Results  Component Value Date   WBC  4.8 10/26/2018   HGB 12.9 10/26/2018   HCT 37.0 10/26/2018   MCV 88 10/26/2018   PLT 247 11/28/2015      Component Value Date/Time   NA 140 10/26/2018 1013   K  5.2 10/26/2018 1013   CL 101 10/26/2018 1013   CO2 25 10/26/2018 1013   GLUCOSE 94 10/26/2018 1013   GLUCOSE 78 11/28/2015 1249   BUN 14 10/26/2018 1013   CREATININE 0.97 10/26/2018 1013   CALCIUM 9.4 10/26/2018 1013   PROT 6.7 10/26/2018 1013   ALBUMIN 4.6 10/26/2018 1013   AST 29 10/26/2018 1013   ALT 12 10/26/2018 1013   ALKPHOS 56 10/26/2018 1013   BILITOT 0.4 10/26/2018 1013   GFRNONAA 63 10/26/2018 1013   GFRAA 72 10/26/2018 1013   No results found for: CHOL, HDL, LDLCALC, LDLDIRECT, TRIG, CHOLHDL No results found for: HGBA1C Lab Results  Component Value Date   VITAMINB12 230 (L) 10/26/2018   Lab Results  Component Value Date   TSH 0.476 10/26/2018   ASSESSMENT AND PLAN 64 y.o. year old female  has a past medical history of Anxiety, Cancer ([place]), Depression, Hypothyroidism, Insomnia, Migraine, Neck pain, and Shoulder pain. here with:  1.  Mild cognitive impairment -MRI of the brain did not show significant abnormality -Laboratory evaluation (TSH, RPR, B12, folate, CBC, CMP) showed low B12 230, getting injections from PCP -Last memory score was 29/30 -Feels overall stable, slightly improved with B12 injections  2.  Chronic migraine headache -Overall, stable, doing well, average 4 headaches a month, good benefit with Imitrex tablet -Continue nortriptyline 25 mg, 2 tablets at bedtime -Continue Inderal LA 60 mg as preventative medication -Continue Imitrex 100 mg as needed -Keeps Imitrex injection on hand just in case -Medications refilled for 1 year, follow-up in 1 year or sooner if needed   I spent 15 minutes with the patient. 50% of this time was spent discussing her plan of care.   Butler Denmark, AGNP-C, DNP 04/26/2019, 9:40 AM [facility] [address] [place], [place]  [address] [phone]

## 2019-04-26 NOTE — Patient Instructions (Signed)
It was nice to meet you today! Continue current medications  See you back in 1 year

## 2019-04-28 NOTE — Progress Notes (Signed)
I have reviewed and agreed above plan. 

## 2019-04-29 DIAGNOSIS — U071 COVID-19: Secondary | ICD-10-CM | POA: Diagnosis not present

## 2019-04-29 DIAGNOSIS — Z20828 Contact with and (suspected) exposure to other viral communicable diseases: Secondary | ICD-10-CM | POA: Diagnosis not present

## 2019-05-09 DIAGNOSIS — Z0000 Encounter for general adult medical examination without abnormal findings: Secondary | ICD-10-CM | POA: Diagnosis not present

## 2019-05-09 DIAGNOSIS — E039 Hypothyroidism, unspecified: Secondary | ICD-10-CM | POA: Diagnosis not present

## 2019-05-09 DIAGNOSIS — E7800 Pure hypercholesterolemia, unspecified: Secondary | ICD-10-CM | POA: Diagnosis not present

## 2019-05-09 DIAGNOSIS — E538 Deficiency of other specified B group vitamins: Secondary | ICD-10-CM | POA: Diagnosis not present

## 2019-05-15 DIAGNOSIS — D045 Carcinoma in situ of skin of trunk: Secondary | ICD-10-CM | POA: Diagnosis not present

## 2019-05-16 DIAGNOSIS — E039 Hypothyroidism, unspecified: Secondary | ICD-10-CM | POA: Diagnosis not present

## 2019-05-16 DIAGNOSIS — E538 Deficiency of other specified B group vitamins: Secondary | ICD-10-CM | POA: Diagnosis not present

## 2019-05-16 DIAGNOSIS — Z0000 Encounter for general adult medical examination without abnormal findings: Secondary | ICD-10-CM | POA: Diagnosis not present

## 2019-05-16 DIAGNOSIS — E7800 Pure hypercholesterolemia, unspecified: Secondary | ICD-10-CM | POA: Diagnosis not present

## 2019-06-12 DIAGNOSIS — E538 Deficiency of other specified B group vitamins: Secondary | ICD-10-CM | POA: Diagnosis not present

## 2019-07-17 ENCOUNTER — Other Ambulatory Visit: Payer: Self-pay | Admitting: *Deleted

## 2019-07-17 DIAGNOSIS — E538 Deficiency of other specified B group vitamins: Secondary | ICD-10-CM | POA: Diagnosis not present

## 2019-07-17 MED ORDER — SUMATRIPTAN SUCCINATE REFILL 6 MG/0.5ML ~~LOC~~ SOCT: 6.0000 mg | SUBCUTANEOUS | 2 refills | Status: DC | PRN

## 2019-08-07 DIAGNOSIS — D485 Neoplasm of uncertain behavior of skin: Secondary | ICD-10-CM | POA: Diagnosis not present

## 2019-08-07 DIAGNOSIS — L910 Hypertrophic scar: Secondary | ICD-10-CM | POA: Diagnosis not present

## 2019-08-07 DIAGNOSIS — L570 Actinic keratosis: Secondary | ICD-10-CM | POA: Diagnosis not present

## 2019-08-07 DIAGNOSIS — Z85820 Personal history of malignant melanoma of skin: Secondary | ICD-10-CM | POA: Diagnosis not present

## 2019-08-07 DIAGNOSIS — L821 Other seborrheic keratosis: Secondary | ICD-10-CM | POA: Diagnosis not present

## 2019-08-07 DIAGNOSIS — C44319 Basal cell carcinoma of skin of other parts of face: Secondary | ICD-10-CM | POA: Diagnosis not present

## 2019-08-21 DIAGNOSIS — E538 Deficiency of other specified B group vitamins: Secondary | ICD-10-CM | POA: Diagnosis not present

## 2019-09-11 DIAGNOSIS — E538 Deficiency of other specified B group vitamins: Secondary | ICD-10-CM | POA: Diagnosis not present

## 2019-09-14 DIAGNOSIS — Z01419 Encounter for gynecological examination (general) (routine) without abnormal findings: Secondary | ICD-10-CM | POA: Diagnosis not present

## 2019-09-14 DIAGNOSIS — N952 Postmenopausal atrophic vaginitis: Secondary | ICD-10-CM | POA: Diagnosis not present

## 2019-09-14 DIAGNOSIS — M8580 Other specified disorders of bone density and structure, unspecified site: Secondary | ICD-10-CM | POA: Diagnosis not present

## 2019-09-14 DIAGNOSIS — Z1231 Encounter for screening mammogram for malignant neoplasm of breast: Secondary | ICD-10-CM | POA: Diagnosis not present

## 2019-09-14 DIAGNOSIS — Z681 Body mass index (BMI) 19 or less, adult: Secondary | ICD-10-CM | POA: Diagnosis not present

## 2019-10-23 DIAGNOSIS — E538 Deficiency of other specified B group vitamins: Secondary | ICD-10-CM | POA: Diagnosis not present

## 2019-10-23 DIAGNOSIS — Z23 Encounter for immunization: Secondary | ICD-10-CM | POA: Diagnosis not present

## 2019-10-31 DIAGNOSIS — Z03818 Encounter for observation for suspected exposure to other biological agents ruled out: Secondary | ICD-10-CM | POA: Diagnosis not present

## 2020-01-29 DIAGNOSIS — L578 Other skin changes due to chronic exposure to nonionizing radiation: Secondary | ICD-10-CM | POA: Diagnosis not present

## 2020-01-29 DIAGNOSIS — L905 Scar conditions and fibrosis of skin: Secondary | ICD-10-CM | POA: Diagnosis not present

## 2020-01-29 DIAGNOSIS — L720 Epidermal cyst: Secondary | ICD-10-CM | POA: Diagnosis not present

## 2020-01-29 DIAGNOSIS — L821 Other seborrheic keratosis: Secondary | ICD-10-CM | POA: Diagnosis not present

## 2020-04-24 NOTE — Progress Notes (Signed)
PATIENT: Abigail Stevens DOB: 1955/06/08  REASON FOR VISIT: follow up HISTORY FROM: patient  HISTORY OF PRESENT ILLNESS: Today 04/25/20  HISTORY OF PRESENT ILLNESS:Abigail C Allenis a 65 years old right-handed female, seen in refer by her primary care physician from [facility] Dr. Jani Gravel in November 01 2014 for evaluation of frequent headaches  She reported a history of migraine headaches since 53 years old, her father also suffered typical migraines, her typical migraines are lateralized severe pounding headache was associated light noise sensitivity, nauseous, lasting for a few hours. She has gone through a lot of stress since 2015, complains of increased headache over the past 2 years, for a while, she has been taking frequent ibuprofen, Tylenol, without helping her symptoms, now she has tapered off daily analgesic use, continue complains frequent almost daily variable degree of headaches. She was not able to identify any clear triggers, she also woke up with severe right side retro-orbital area pounding headaches, spreading from right frontal parietal to right neck and shoulder region, 8 out of 10, she has been taking Imitrex tablet, which helps her some, but often she has to take second dose, she used upper 9 tablet monthly supply within 2 weeks,  Over the years, she has tried different preventive medications, butterbur, Inderal 60 mg twice a day, which seems to help her some, but the benefit does not sustain, she also has tried acupuncture, massage, chiropractor, ice pack, magnesium oxide, neck stretching exercise, ergonomic approach with limited help.  She is now using frequent ice pack, Imitrex 100 mg tablets as needed, Relpax works better, but it cost 150 $ for 4 tablets  UPDATE Dec 03 2014:YYShe is taking Nortriptyline 10mg  po qhs, her headache has much improved, she still gets average one headache every week, imitrex 100mg  as needed has been helpful, she  did not get the chance to try Imitrex injection  UPDATE April 25th 2017:YYHer headache overall has much improved, she is able to tolerating nortriptyline 10 mg 2 tablets every night, and also taking Inderal 60 mg daily, she has average 2-3 mild headaches each week, use Imitrex tablets, about once or twice each months she get more severe headache requiring Imitrex injection, which has been helpful, she also has significant nausea or vomiting with her severe migraine headaches, her headache often started at the left neck, spreading forward to become retro-orbital area headache, I also suggested thermalcare heating pad.  UPDATE Sept 16 2020: Her migraine is overall under good control, she has migraine about every couple months, responding well to Imitrex, trigger for her migraines are bright light, exertion, wines, she has taking nortriptyline, 25 mg 2 tablets every night for preventive medication, propanolol LA 60 mg at bedtime, Lexapro 10 mg daily  Her mother suffered dementia in her 28s, she now works as an Freight forwarder, noticed mild memory loss, tends to repeat herself.   Update April 26, 2019 SS: When last seen, reported mild memory loss, labs showed low B12 230.  MRI of the brain in September 2020 was unremarkable. Headaches are about the same, 4 times a month, will take Imitrex, use ice on her head, it will stop the migraines usually. Feels memory is stable. Has to write everything down, is HR consultant, does her job well. She is seeing PCP getting B12 shots from PCP, thinks it has helped memory. She keeps Imitrex injection if needed, none in the last year, Phenergan if needed.  Update April 25, 2020 SS: Here today  alone, has 1-2 mild migraine headaches a week, will take Imitrex tablet with good benefit. Keeps Imitrex injection as needed, usually only uses twice a year. Keeps phenergan PRN.  Had trouble getting the syringe with the Imitrex injection last refill. Retired end of January. Scared of  memory loss, her mother had Dementia, can lose her train of thought. Stopped B 12 injections last year, didn't do supplement. Seeing PCP next month. Has to be so careful with migraine triggers, affects her quality of life. MMSE 30/30 today.   REVIEW OF SYSTEMS: Out of a complete 14 system review of symptoms, the patient complains only of the following symptoms, and all other reviewed systems are negative.  Headache  ALLERGIES: Allergies  Allergen Reactions  . Codeine Nausea And Vomiting  . White Petrolatum Itching    Blisters skin Blisters skin     HOME MEDICATIONS: Outpatient Medications Prior to Visit  Medication Sig Dispense Refill  . ALPRAZolam (XANAX) 0.25 MG tablet as needed.    . conjugated estrogens (PREMARIN) vaginal cream Place 1 Applicatorful vaginally 2 (two) times a week.     . escitalopram (LEXAPRO) 10 MG tablet 10 mg daily.    . fluticasone (FLONASE) 50 MCG/ACT nasal spray Place 1 spray into both nostrils daily.     Marland Kitchen levothyroxine (SYNTHROID, LEVOTHROID) 75 MCG tablet Take 75 mcg by mouth See admin instructions. Taking 2mcg Mon-Fri, 37.5mg  Sat-Sun.    . nortriptyline (PAMELOR) 25 MG capsule Take 2 capsules (50 mg total) by mouth at bedtime. 180 capsule 3  . promethazine (PHENERGAN) 25 MG tablet Take 1 tablet (25 mg total) by mouth every 6 (six) hours as needed for nausea or vomiting. #30 per month. 30 tablet 5  . propranolol ER (INDERAL LA) 60 MG 24 hr capsule Take 1 capsule (60 mg total) by mouth at bedtime. 90 capsule 3  . SUMAtriptan (IMITREX) 100 MG tablet Take 1 tablet (100 mg total) by mouth once as needed for up to 1 dose for migraine. May repeat in 2 hours if headache persists or recurs. 9 tablet 11  . SUMAtriptan Succinate Refill (IMITREX STATDOSE REFILL) 6 MG/0.5ML SOCT Inject 6 mg into the skin as needed. 5 mL 2   No facility-administered medications prior to visit.    PAST MEDICAL HISTORY: Past Medical History:  Diagnosis Date  . Anxiety   . Cancer  ([facility])    basal cell carcinoma  right cheek and melanoma on back  . Depression   . Hypothyroidism   . Insomnia   . Migraine   . Neck pain   . Shoulder pain     PAST SURGICAL HISTORY: Past Surgical History:  Procedure Laterality Date  . APPENDECTOMY    . CESAREAN SECTION    . COLONOSCOPY    . DEBRIDEMENT AND CLOSURE WOUND Right 11/28/2015   Procedure: COMPLEX WOUND CLOSER OF RIGHT CHEEK;  Surgeon: Crissie Reese, MD;  Location: [facility];  Service: Plastics;  Laterality: Right;  . DILATION AND CURETTAGE OF UTERUS    . MELANOMA EXCISION Right 11/28/2015   Procedure: MELANOMA EXCISION;  Surgeon: Crissie Reese, MD;  Location: [facility];  Service: Plastics;  Laterality: Right;  . TONSILLECTOMY    . TUBAL LIGATION    . WISDOM TOOTH EXTRACTION      FAMILY HISTORY: Family History  Problem Relation Age of Onset  . Aneurysm Mother   . Dementia Mother   . Heart disease Father   . Kidney disease Father   . Diabetes Father   .  Migraines Father     SOCIAL HISTORY: Social History   Socioeconomic History  . Marital status: Single    Spouse name: Not on file  . Number of children: 2  . Years of education: 9  . Highest education level: Not on file  Occupational History  . Occupation: HR Consultant  Tobacco Use  . Smoking status: Former Smoker    Types: Cigarettes  . Smokeless tobacco: Never Used  . Tobacco comment: Quit January 2007  Substance and Sexual Activity  . Alcohol use: Yes    Alcohol/week: 0.0 standard drinks    Comment: One drink per day and 2 drinks per day on the weekends.  . Drug use: No  . Sexual activity: Not on file  Other Topics Concern  . Not on file  Social History Narrative   Lives at home with son and daughter.   Right-handed.   1-2 cups caffeine per day.   Social Determinants of Health   Financial Resource Strain: Not on file  Food Insecurity: Not on file  Transportation Needs: Not on file  Physical Activity: Not on file  Stress: Not on file  Social  Connections: Not on file  Intimate Partner Violence: Not on file   PHYSICAL EXAM  Vitals:   04/25/20 0955  BP: 127/76  Pulse: (!) 58  Weight: 105 lb (47.6 kg)  Height: 5\' 3"  (1.6 m)   Body mass index is 18.6 kg/m.  Generalized: Well developed, in no acute distress  MMSE - Mini Mental State Exam 04/25/2020 10/26/2018  Orientation to time 5 5  Orientation to Place 5 5  Registration 3 3  Attention/ Calculation 5 5  Recall 3 2  Language- name 2 objects 2 2  Language- repeat 1 1  Language- follow 3 step command 3 3  Language- read & follow direction 1 1  Write a sentence 1 1  Copy design 1 1  Total score 30 29     Neurological examination  Mentation: Alert oriented to time, place, history taking. Follows all commands speech and language fluent Cranial nerve II-XII: Pupils were equal round reactive to light. Extraocular movements were full, visual field were full on confrontational test. Facial sensation and strength were normal. Head turning and shoulder shrug were normal and symmetric. Motor: The motor testing reveals 5 over 5 strength of all 4 extremities. Good symmetric motor tone is noted throughout.  Sensory: Sensory testing is intact to soft touch on all 4 extremities. No evidence of extinction is noted.  Coordination: Cerebellar testing reveals good finger-nose-finger and heel-to-shin bilaterally.  Gait and station: Gait is normal.  Reflexes: Deep tendon reflexes are symmetric and normal bilaterally.   DIAGNOSTIC DATA (LABS, IMAGING, TESTING) - I reviewed patient records, labs, notes, testing and imaging myself where available.  Lab Results  Component Value Date   WBC 4.8 10/26/2018   HGB 12.9 10/26/2018   HCT 37.0 10/26/2018   MCV 88 10/26/2018   PLT 247 11/28/2015      Component Value Date/Time   NA 140 10/26/2018 1013   K 5.2 10/26/2018 1013   CL 101 10/26/2018 1013   CO2 25 10/26/2018 1013   GLUCOSE 94 10/26/2018 1013   GLUCOSE 78 11/28/2015 1249   BUN  14 10/26/2018 1013   CREATININE 0.97 10/26/2018 1013   CALCIUM 9.4 10/26/2018 1013   PROT 6.7 10/26/2018 1013   ALBUMIN 4.6 10/26/2018 1013   AST 29 10/26/2018 1013   ALT 12 10/26/2018 1013   ALKPHOS 56  10/26/2018 1013   BILITOT 0.4 10/26/2018 1013   GFRNONAA 63 10/26/2018 1013   GFRAA 72 10/26/2018 1013   No results found for: CHOL, HDL, LDLCALC, LDLDIRECT, TRIG, CHOLHDL No results found for: HGBA1C Lab Results  Component Value Date   VITAMINB12 230 (L) 10/26/2018   Lab Results  Component Value Date   TSH 0.476 10/26/2018   ASSESSMENT AND PLAN 65 y.o. year old female  has a past medical history of Anxiety, Cancer ([place]), Depression, Hypothyroidism, Insomnia, Migraine, Neck pain, and Shoulder pain. here with:  1.  Mild cognitive impairment -MRI of the brain did not show significant abnormality -Laboratory evaluation (TSH, RPR, B12, folate, CBC, CMP) showed low B12 230, did B12 injections with PCP, stopped last year, needs to be rechecked seeing PCP next month -MMSE 30/30 today -Feels B12 injections helped, her mother had dementia  2.  Chronic migraine headache -On average 1-2 a week, wonders about new options  -Try Ajovy 225 mg monthly injection for migraine prevention -For now, continue other preventative medications: nortriptyline, Inderal LA, if excellent benefit with Ajovy will wean off preventative  -Continue Imitrex tablet, injection for severe headache -Return in 6 months to see benefit of CGRP  I spent 30 minutes of face-to-face and non-face-to-face time with patient.  This included previsit chart review, lab review, study review, order entry, electronic health record documentation, patient education.  Butler Denmark, AGNP-C, DNP 04/25/2020, 10:32 AM [facility] [address] [place], [place] [address] [phone]

## 2020-04-25 ENCOUNTER — Encounter: Payer: Self-pay | Admitting: Neurology

## 2020-04-25 ENCOUNTER — Other Ambulatory Visit: Payer: Self-pay | Admitting: Neurology

## 2020-04-25 ENCOUNTER — Ambulatory Visit (INDEPENDENT_AMBULATORY_CARE_PROVIDER_SITE_OTHER): Payer: BC Managed Care – PPO | Admitting: Neurology

## 2020-04-25 ENCOUNTER — Telehealth: Payer: Self-pay | Admitting: Emergency Medicine

## 2020-04-25 VITALS — BP 127/76 | HR 58 | Ht 63.0 in | Wt 105.0 lb

## 2020-04-25 DIAGNOSIS — G43709 Chronic migraine without aura, not intractable, without status migrainosus: Secondary | ICD-10-CM

## 2020-04-25 DIAGNOSIS — R413 Other amnesia: Secondary | ICD-10-CM | POA: Diagnosis not present

## 2020-04-25 MED ORDER — SUMATRIPTAN SUCCINATE REFILL 6 MG/0.5ML ~~LOC~~ SOCT: 6.0000 mg | SUBCUTANEOUS | 2 refills | Status: DC | PRN

## 2020-04-25 MED ORDER — SUMATRIPTAN SUCCINATE 100 MG PO TABS: 100.0000 mg | ORAL_TABLET | Freq: Once | ORAL | 11 refills | Status: DC | PRN

## 2020-04-25 MED ORDER — AJOVY 225 MG/1.5ML ~~LOC~~ SOAJ: 225.0000 mg | SUBCUTANEOUS | 11 refills | Status: DC

## 2020-04-25 NOTE — Telephone Encounter (Signed)
PA for Ajovy started on [facility]: [id number]  Effective from 04/25/2020 through 07/17/2020.

## 2020-04-25 NOTE — Patient Instructions (Signed)
Start Ajovy 225 mg monthly injection for migraine prevention  For now continue other medications for now  See you back in 6 months to see benefit of Ajovy

## 2020-05-07 ENCOUNTER — Other Ambulatory Visit: Payer: Self-pay | Admitting: Neurology

## 2020-05-15 DIAGNOSIS — E7800 Pure hypercholesterolemia, unspecified: Secondary | ICD-10-CM | POA: Diagnosis not present

## 2020-05-15 DIAGNOSIS — E538 Deficiency of other specified B group vitamins: Secondary | ICD-10-CM | POA: Diagnosis not present

## 2020-05-15 DIAGNOSIS — R002 Palpitations: Secondary | ICD-10-CM | POA: Diagnosis not present

## 2020-05-15 DIAGNOSIS — Z0000 Encounter for general adult medical examination without abnormal findings: Secondary | ICD-10-CM | POA: Diagnosis not present

## 2020-05-15 DIAGNOSIS — E039 Hypothyroidism, unspecified: Secondary | ICD-10-CM | POA: Diagnosis not present

## 2020-07-23 ENCOUNTER — Encounter: Payer: Self-pay | Admitting: *Deleted

## 2020-07-23 ENCOUNTER — Telehealth: Payer: Self-pay | Admitting: *Deleted

## 2020-07-23 NOTE — Telephone Encounter (Signed)
Ajovy approved Effective from 07/23/2020 through 07/22/2021. Sent my chart to inform patient.

## 2020-07-23 NOTE — Telephone Encounter (Signed)
Ajovy PA, key: [id number], [id number].  Your information has been submitted to [place]. [employer] will review the request and notify you of the determination decision directly, typically within 72 hours of receiving all information. If [employer] has not responded within the specified timeframe or if you have any questions about your PA submission, contact [place] directly at [phone].

## 2020-08-05 DIAGNOSIS — D485 Neoplasm of uncertain behavior of skin: Secondary | ICD-10-CM | POA: Diagnosis not present

## 2020-08-05 DIAGNOSIS — C441191 Basal cell carcinoma of skin of left upper eyelid, including canthus: Secondary | ICD-10-CM | POA: Diagnosis not present

## 2020-08-05 DIAGNOSIS — Z85820 Personal history of malignant melanoma of skin: Secondary | ICD-10-CM | POA: Diagnosis not present

## 2020-08-05 DIAGNOSIS — L821 Other seborrheic keratosis: Secondary | ICD-10-CM | POA: Diagnosis not present

## 2020-08-05 DIAGNOSIS — Z85828 Personal history of other malignant neoplasm of skin: Secondary | ICD-10-CM | POA: Diagnosis not present

## 2020-09-09 DIAGNOSIS — C44319 Basal cell carcinoma of skin of other parts of face: Secondary | ICD-10-CM | POA: Diagnosis not present

## 2020-09-09 DIAGNOSIS — D485 Neoplasm of uncertain behavior of skin: Secondary | ICD-10-CM | POA: Diagnosis not present

## 2020-09-09 DIAGNOSIS — C441191 Basal cell carcinoma of skin of left upper eyelid, including canthus: Secondary | ICD-10-CM | POA: Diagnosis not present

## 2020-09-16 DIAGNOSIS — L905 Scar conditions and fibrosis of skin: Secondary | ICD-10-CM | POA: Diagnosis not present

## 2020-10-23 NOTE — Progress Notes (Signed)
Chart reviewed, agree above plan ?

## 2020-10-30 ENCOUNTER — Ambulatory Visit: Payer: BC Managed Care – PPO | Admitting: Family Medicine

## 2020-11-05 DIAGNOSIS — C44319 Basal cell carcinoma of skin of other parts of face: Secondary | ICD-10-CM | POA: Diagnosis not present

## 2020-11-05 DIAGNOSIS — L281 Prurigo nodularis: Secondary | ICD-10-CM | POA: Diagnosis not present

## 2020-11-12 DIAGNOSIS — L905 Scar conditions and fibrosis of skin: Secondary | ICD-10-CM | POA: Diagnosis not present

## 2020-12-17 DIAGNOSIS — E039 Hypothyroidism, unspecified: Secondary | ICD-10-CM | POA: Diagnosis not present

## 2020-12-17 DIAGNOSIS — Z6820 Body mass index (BMI) 20.0-20.9, adult: Secondary | ICD-10-CM | POA: Diagnosis not present

## 2020-12-17 DIAGNOSIS — Z1231 Encounter for screening mammogram for malignant neoplasm of breast: Secondary | ICD-10-CM | POA: Diagnosis not present

## 2020-12-17 DIAGNOSIS — Z01419 Encounter for gynecological examination (general) (routine) without abnormal findings: Secondary | ICD-10-CM | POA: Diagnosis not present

## 2020-12-17 DIAGNOSIS — N952 Postmenopausal atrophic vaginitis: Secondary | ICD-10-CM | POA: Diagnosis not present

## 2020-12-17 DIAGNOSIS — M816 Localized osteoporosis [Lequesne]: Secondary | ICD-10-CM | POA: Diagnosis not present

## 2020-12-17 DIAGNOSIS — N958 Other specified menopausal and perimenopausal disorders: Secondary | ICD-10-CM | POA: Diagnosis not present

## 2020-12-18 DIAGNOSIS — Z23 Encounter for immunization: Secondary | ICD-10-CM | POA: Diagnosis not present

## 2021-01-14 DIAGNOSIS — L821 Other seborrheic keratosis: Secondary | ICD-10-CM | POA: Diagnosis not present

## 2021-01-14 DIAGNOSIS — L905 Scar conditions and fibrosis of skin: Secondary | ICD-10-CM | POA: Diagnosis not present

## 2021-01-20 ENCOUNTER — Other Ambulatory Visit: Payer: Self-pay | Admitting: *Deleted

## 2021-01-20 MED ORDER — SUMATRIPTAN SUCCINATE 100 MG PO TABS: ORAL_TABLET | ORAL | 3 refills | Status: DC

## 2021-02-12 DIAGNOSIS — Z85820 Personal history of malignant melanoma of skin: Secondary | ICD-10-CM | POA: Diagnosis not present

## 2021-02-12 DIAGNOSIS — L218 Other seborrheic dermatitis: Secondary | ICD-10-CM | POA: Diagnosis not present

## 2021-02-12 DIAGNOSIS — Z129 Encounter for screening for malignant neoplasm, site unspecified: Secondary | ICD-10-CM | POA: Diagnosis not present

## 2021-02-12 DIAGNOSIS — Z85828 Personal history of other malignant neoplasm of skin: Secondary | ICD-10-CM | POA: Diagnosis not present

## 2021-02-12 DIAGNOSIS — L988 Other specified disorders of the skin and subcutaneous tissue: Secondary | ICD-10-CM | POA: Diagnosis not present

## 2021-02-12 DIAGNOSIS — L570 Actinic keratosis: Secondary | ICD-10-CM | POA: Diagnosis not present

## 2021-02-12 DIAGNOSIS — I781 Nevus, non-neoplastic: Secondary | ICD-10-CM | POA: Diagnosis not present

## 2021-02-17 DIAGNOSIS — M810 Age-related osteoporosis without current pathological fracture: Secondary | ICD-10-CM | POA: Diagnosis not present

## 2021-02-19 DIAGNOSIS — Z23 Encounter for immunization: Secondary | ICD-10-CM | POA: Diagnosis not present

## 2021-02-24 NOTE — Patient Instructions (Addendum)
Below is our plan:  We will continue Ajovy and sumatriptan. Please avoid regular usage of abortive medications. Use phenergan sparingly for nausea. Please call if you need [place]!  Please make sure you are staying well hydrated. I recommend 50-60 ounces daily. Well balanced diet and regular exercise encouraged. Consistent sleep schedule with 6-8 hours recommended.   Please continue follow up with care team as directed.   Follow up with Judson Roch in 1 year   You may receive a survey regarding today's visit. I encourage you to leave honest feed back as I do use this information to improve patient care. Thank you for seeing me today!

## 2021-02-24 NOTE — Progress Notes (Signed)
Chief Complaint  Patient presents with   Follow-up    Rm 2, alone. Here to f/u for migraines. Pt d/c propranolol and nortriptyline and has been doing well on Ajovy. Pt reports her trigger are down. Pt would like to get auto pen injectors instead of cartridges.      HISTORY OF PRESENT ILLNESS:  02/25/21 ALL:  Abigail Stevens is a 66 y.o. female here today for follow up for migraines. She discontinued propranolol ER 60mg  daily and nortriptyline 50mg  QHS but has continued Ajovy monthly. She does not feel that migraines are any worse on Ajovy alone. Sumatriptan tablets and injections work well in abortive therapy. She may need 6-9 tablets per month. She may use sumatriptan injections twice a year. She does not take OTC analgesics. She feels that she is well managed at this time.   317/22 SS: Here today alone, has 1-2 mild migraine headaches a week, will take Imitrex tablet with good benefit. Keeps Imitrex injection as needed, usually only uses twice a year. Keeps phenergan PRN.  Had trouble getting the syringe with the Imitrex injection last refill. Retired end of January. Scared of memory loss, her mother had Dementia, can lose her train of thought. Stopped B 12 injections last year, didn't do supplement. Seeing PCP next month. Has to be so careful with migraine triggers, affects her quality of life. MMSE 30/30 today.    REVIEW OF SYSTEMS: Out of a complete 14 system review of symptoms, the patient complains only of the following symptoms, headaches and all other reviewed systems are negative.   ALLERGIES: Allergies  Allergen Reactions   Codeine Nausea And Vomiting   White Petrolatum Itching    Blisters skin Blisters skin      HOME MEDICATIONS: Outpatient Medications Prior to Visit  Medication Sig Dispense Refill   ALPRAZolam (XANAX) 0.25 MG tablet as needed.     escitalopram (LEXAPRO) 10 MG tablet 10 mg daily.     levothyroxine (SYNTHROID, LEVOTHROID) 75 MCG tablet Take 75 mcg by  mouth See admin instructions. Taking 66mcg Mon-Fri, 37.5mg  Sat-Sun.     Fremanezumab-vfrm (AJOVY) 225 MG/1.5ML SOAJ Inject 225 mg into the skin every 30 (thirty) days. 1.68 mL 11   promethazine (PHENERGAN) 25 MG tablet Take 1 tablet (25 mg total) by mouth every 6 (six) hours as needed for nausea or vomiting. #30 per month. 30 tablet 5   SUMAtriptan 6 MG/0.5ML SOAJ Inject 6 mg into the skin as needed.     conjugated estrogens (PREMARIN) vaginal cream Place 1 Applicatorful vaginally 2 (two) times a week.      fluticasone (FLONASE) 50 MCG/ACT nasal spray Place 1 spray into both nostrils daily.      nortriptyline (PAMELOR) 25 MG capsule Take 2 capsules (50 mg total) by mouth at bedtime. 180 capsule 3   propranolol ER (INDERAL LA) 60 MG 24 hr capsule Take 1 capsule (60 mg total) by mouth at bedtime. 90 capsule 3   SUMAtriptan (IMITREX) 100 MG tablet Take 1 tab at onset of migraine.  May repeat in 2 hrs, if needed.  Max dose: 2 tabs/day or 9 tabs/month. 90-day rx. 27 tablet 3   SUMAtriptan Succinate Refill (IMITREX STATDOSE REFILL) 6 MG/0.5ML SOCT Inject 6 mg into the skin as needed. 5 mL 2   No facility-administered medications prior to visit.     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   Depression    Hypothyroidism    Insomnia    Migraine    Neck pain    Shoulder pain      PAST SURGICAL HISTORY: Past Surgical History:  Procedure Laterality Date   APPENDECTOMY     CESAREAN SECTION     COLONOSCOPY     DEBRIDEMENT AND CLOSURE WOUND Right 11/28/2015   Procedure: COMPLEX WOUND CLOSER OF RIGHT CHEEK;  Surgeon: Crissie Reese, MD;  Location: [facility];  Service: Plastics;  Laterality: Right;   DILATION AND CURETTAGE OF UTERUS     MELANOMA EXCISION Right 11/28/2015   Procedure: MELANOMA EXCISION;  Surgeon: Crissie Reese, MD;  Location: [facility];  Service: Plastics;  Laterality: Right;    TONSILLECTOMY     TUBAL LIGATION     WISDOM TOOTH EXTRACTION       FAMILY HISTORY: Family History  Problem Relation Age of Onset   Aneurysm Mother    Dementia Mother    Heart disease Father    Kidney disease Father    Diabetes Father    Migraines Father      SOCIAL HISTORY: Social History   Socioeconomic History   Marital status: Single    Spouse name: Not on file   Number of children: 2   Years of education: 100   Highest education level: Not on file  Occupational History   Occupation: HR Consultant  Tobacco Use   Smoking status: Former    Types: Cigarettes   Smokeless tobacco: Never   Tobacco comments:    Quit January 2007  Substance and Sexual Activity   Alcohol use: Yes    Alcohol/week: 0.0 standard drinks    Comment: One drink per day and 2 drinks per day on the weekends.   Drug use: No   Sexual activity: Not on file  Other Topics Concern   Not on file  Social History Narrative   Lives at home with son and daughter.   Right-handed.   1-2 cups caffeine per day.   Social Determinants of Health   Financial Resource Strain: Not on file  Food Insecurity: Not on file  Transportation Needs: Not on file  Physical Activity: Not on file  Stress: Not on file  Social Connections: Not on file  Intimate Partner Violence: Not on file     PHYSICAL EXAM  Vitals:   02/25/21 1029  BP: 96/72  Pulse: 64  SpO2: 98%  Weight: 105 lb 8 oz (47.9 kg)  Height: 5\' 3"  (1.6 m)   Body mass index is 18.69 kg/m.  Generalized: Well developed, in no acute distress  Cardiology: normal rate and rhythm, no murmur auscultated  Respiratory: clear to auscultation bilaterally    Neurological examination  Mentation: Alert oriented to time, place, history taking. Follows all commands speech and language fluent Cranial nerve II-XII: Pupils were equal round reactive to light. Extraocular movements were full, visual field were full on confrontational test. Facial sensation and  strength were normal. Head turning and shoulder shrug  were normal and symmetric. Motor: The motor testing reveals 5 over 5 strength of all 4 extremities. Good symmetric motor tone is noted throughout.  Sensory: Sensory testing is intact to soft touch on all 4 extremities. No evidence of extinction is noted.  Coordination: Cerebellar testing reveals good finger-nose-finger and heel-to-shin bilaterally.  Gait and station: Gait is normal.    DIAGNOSTIC DATA (LABS, IMAGING, TESTING) - I reviewed patient records, labs, notes, testing and  imaging myself where available.  Lab Results  Component Value Date   WBC 4.8 10/26/2018   HGB 12.9 10/26/2018   HCT 37.0 10/26/2018   MCV 88 10/26/2018   PLT 247 11/28/2015      Component Value Date/Time   NA 140 10/26/2018 1013   K 5.2 10/26/2018 1013   CL 101 10/26/2018 1013   CO2 25 10/26/2018 1013   GLUCOSE 94 10/26/2018 1013   GLUCOSE 78 11/28/2015 1249   BUN 14 10/26/2018 1013   CREATININE 0.97 10/26/2018 1013   CALCIUM 9.4 10/26/2018 1013   PROT 6.7 10/26/2018 1013   ALBUMIN 4.6 10/26/2018 1013   AST 29 10/26/2018 1013   ALT 12 10/26/2018 1013   ALKPHOS 56 10/26/2018 1013   BILITOT 0.4 10/26/2018 1013   GFRNONAA 63 10/26/2018 1013   GFRAA 72 10/26/2018 1013   No results found for: CHOL, HDL, LDLCALC, LDLDIRECT, TRIG, CHOLHDL No results found for: HGBA1C Lab Results  Component Value Date   VITAMINB12 230 (L) 10/26/2018   Lab Results  Component Value Date   TSH 0.476 10/26/2018    MMSE - Mini Mental State Exam 04/25/2020 10/26/2018  Orientation to time 5 5  Orientation to Place 5 5  Registration 3 3  Attention/ Calculation 5 5  Recall 3 2  Language- name 2 objects 2 2  Language- repeat 1 1  Language- follow 3 step command 3 3  Language- read & follow direction 1 1  Write a sentence 1 1  Copy design 1 1  Total score 30 29     No flowsheet data found.   ASSESSMENT AND PLAN  66 y.o. year old female  has a past medical  history of Anxiety, Cancer ([place]), Depression, Hypothyroidism, Insomnia, Migraine, Neck pain, and Shoulder pain. here with    Chronic migraine w/o aura w/o status migrainosus, not intractable  Abigail Stevens is doing well on current treatment plan. She will continue Ajovy every 30 days. Sumatriptan tablets and injections as needed. Do not exceed two doses in 24 hours or 9 per month. She will continue phenergan sparingly as needed for nausea associated with migraines. She was encouraged to call with any worsening symptoms. Healthy lifestyle habits encouraged. She will follow up in 1 year, sooner if needed.    No orders of the defined types were placed in this encounter.    Meds ordered this encounter  Medications   Fremanezumab-vfrm (AJOVY) 225 MG/1.5ML SOAJ    Sig: Inject 225 mg into the skin every 30 (thirty) days.    Dispense:  4.5 mL    Refill:  3    Order Specific Question:   Supervising Provider    Answer:   Melvenia Beam [[id number]]   SUMAtriptan (IMITREX) 100 MG tablet    Sig: Take 1 tab at onset of migraine.  May repeat in 2 hrs, if needed.  Max dose: 2 tabs/day or 9 tabs/month. 90-day rx.    Dispense:  27 tablet    Refill:  3    Max of #2/day or #9/month.    Order Specific Question:   Supervising Provider    Answer:   Melvenia Beam [[id number]]   SUMAtriptan 6 MG/0.5ML SOAJ    Sig: Inject 6 mg into the skin as needed.    Dispense:  5 mL    Refill:  1    Order Specific Question:   Supervising Provider    Answer:   Melvenia Beam [id number]   promethazine (PHENERGAN) 25  MG tablet    Sig: Take 1 tablet (25 mg total) by mouth every 6 (six) hours as needed for nausea or vomiting. #30 per month.    Dispense:  15 tablet    Refill:  3    #30 per month    Order Specific Question:   Supervising Provider    Answer:   Melvenia Beam [[id number]]      Debbora Presto, MSN, FNP-C 02/25/2021, 11:02 AM  [facility] [address], [place], [place] [address] [phone]

## 2021-02-25 ENCOUNTER — Encounter: Payer: Self-pay | Admitting: Family Medicine

## 2021-02-25 ENCOUNTER — Ambulatory Visit (INDEPENDENT_AMBULATORY_CARE_PROVIDER_SITE_OTHER): Payer: Medicare Other | Admitting: Family Medicine

## 2021-02-25 VITALS — BP 96/72 | HR 64 | Ht 63.0 in | Wt 105.5 lb

## 2021-02-25 DIAGNOSIS — G43709 Chronic migraine without aura, not intractable, without status migrainosus: Secondary | ICD-10-CM

## 2021-02-25 MED ORDER — SUMATRIPTAN SUCCINATE 100 MG PO TABS: ORAL_TABLET | ORAL | 3 refills | Status: DC

## 2021-02-25 MED ORDER — SUMATRIPTAN SUCCINATE 6 MG/0.5ML ~~LOC~~ SOAJ: 6.0000 mg | SUBCUTANEOUS | 1 refills | Status: DC | PRN

## 2021-02-25 MED ORDER — AJOVY 225 MG/1.5ML ~~LOC~~ SOAJ: 225.0000 mg | SUBCUTANEOUS | 3 refills | Status: DC

## 2021-02-25 MED ORDER — PROMETHAZINE HCL 25 MG PO TABS: 25.0000 mg | ORAL_TABLET | Freq: Four times a day (QID) | ORAL | 3 refills | Status: AC | PRN

## 2021-02-26 ENCOUNTER — Telehealth: Payer: Self-pay | Admitting: Neurology

## 2021-02-26 NOTE — Telephone Encounter (Signed)
Submitted PA Ajovy on CMM. KeyJolene Provost - PA Case ID: [id number]. Waiting on determination from [employer].

## 2021-02-26 NOTE — Telephone Encounter (Signed)
PA completed through CMM/[employer] medicare insurance.  QBV:[id number] Approved immediately CaseId:[id number];Status:Approved;Review Type:Prior Auth;Coverage Start Date:02/09/2021;Coverage End Date:02/26/2022;

## 2021-04-01 DIAGNOSIS — B349 Viral infection, unspecified: Secondary | ICD-10-CM | POA: Diagnosis not present

## 2021-04-01 DIAGNOSIS — J209 Acute bronchitis, unspecified: Secondary | ICD-10-CM | POA: Diagnosis not present

## 2021-06-05 DIAGNOSIS — H5213 Myopia, bilateral: Secondary | ICD-10-CM | POA: Diagnosis not present

## 2021-06-05 DIAGNOSIS — H40053 Ocular hypertension, bilateral: Secondary | ICD-10-CM | POA: Diagnosis not present

## 2021-06-05 DIAGNOSIS — H52223 Regular astigmatism, bilateral: Secondary | ICD-10-CM | POA: Diagnosis not present

## 2021-06-05 DIAGNOSIS — H524 Presbyopia: Secondary | ICD-10-CM | POA: Diagnosis not present

## 2021-06-06 ENCOUNTER — Other Ambulatory Visit (HOSPITAL_BASED_OUTPATIENT_CLINIC_OR_DEPARTMENT_OTHER): Payer: Self-pay | Admitting: Family Medicine

## 2021-06-06 DIAGNOSIS — E039 Hypothyroidism, unspecified: Secondary | ICD-10-CM | POA: Diagnosis not present

## 2021-06-06 DIAGNOSIS — E7800 Pure hypercholesterolemia, unspecified: Secondary | ICD-10-CM

## 2021-06-06 DIAGNOSIS — Z0000 Encounter for general adult medical examination without abnormal findings: Secondary | ICD-10-CM | POA: Diagnosis not present

## 2021-06-06 DIAGNOSIS — J0100 Acute maxillary sinusitis, unspecified: Secondary | ICD-10-CM | POA: Diagnosis not present

## 2021-06-06 DIAGNOSIS — E538 Deficiency of other specified B group vitamins: Secondary | ICD-10-CM | POA: Diagnosis not present

## 2021-06-06 DIAGNOSIS — R002 Palpitations: Secondary | ICD-10-CM | POA: Diagnosis not present

## 2021-06-18 DIAGNOSIS — E039 Hypothyroidism, unspecified: Secondary | ICD-10-CM | POA: Diagnosis not present

## 2021-07-08 DIAGNOSIS — H401111 Primary open-angle glaucoma, right eye, mild stage: Secondary | ICD-10-CM | POA: Diagnosis not present

## 2021-07-14 DIAGNOSIS — G510 Bell's palsy: Secondary | ICD-10-CM | POA: Diagnosis not present

## 2021-07-15 ENCOUNTER — Ambulatory Visit (HOSPITAL_BASED_OUTPATIENT_CLINIC_OR_DEPARTMENT_OTHER)
Admission: RE | Admit: 2021-07-15 | Discharge: 2021-07-15 | Disposition: A | Discharge status: Home / Self Care | Payer: Medicare Other | Source: Ambulatory Visit | Attending: Family Medicine | Admitting: Family Medicine

## 2021-07-15 DIAGNOSIS — E7800 Pure hypercholesterolemia, unspecified: Secondary | ICD-10-CM | POA: Insufficient documentation

## 2021-07-24 DIAGNOSIS — G510 Bell's palsy: Secondary | ICD-10-CM | POA: Diagnosis not present

## 2021-07-24 DIAGNOSIS — R6889 Other general symptoms and signs: Secondary | ICD-10-CM | POA: Diagnosis not present

## 2021-07-24 DIAGNOSIS — L659 Nonscarring hair loss, unspecified: Secondary | ICD-10-CM | POA: Diagnosis not present

## 2021-07-24 DIAGNOSIS — R911 Solitary pulmonary nodule: Secondary | ICD-10-CM | POA: Diagnosis not present

## 2021-07-24 DIAGNOSIS — E559 Vitamin D deficiency, unspecified: Secondary | ICD-10-CM | POA: Diagnosis not present

## 2021-07-24 DIAGNOSIS — E039 Hypothyroidism, unspecified: Secondary | ICD-10-CM | POA: Diagnosis not present

## 2021-07-24 DIAGNOSIS — E538 Deficiency of other specified B group vitamins: Secondary | ICD-10-CM | POA: Diagnosis not present

## 2021-07-25 DIAGNOSIS — H40052 Ocular hypertension, left eye: Secondary | ICD-10-CM | POA: Diagnosis not present

## 2021-07-25 DIAGNOSIS — H401111 Primary open-angle glaucoma, right eye, mild stage: Secondary | ICD-10-CM | POA: Diagnosis not present

## 2021-08-18 ENCOUNTER — Encounter: Payer: Self-pay | Admitting: Pulmonary Disease

## 2021-08-18 ENCOUNTER — Ambulatory Visit (INDEPENDENT_AMBULATORY_CARE_PROVIDER_SITE_OTHER): Payer: Medicare Other | Admitting: Pulmonary Disease

## 2021-08-18 VITALS — BP 124/76 | HR 70 | Temp 98.1°F | Ht 62.5 in | Wt 102.0 lb

## 2021-08-18 DIAGNOSIS — R918 Other nonspecific abnormal finding of lung field: Secondary | ICD-10-CM

## 2021-08-18 DIAGNOSIS — Z85828 Personal history of other malignant neoplasm of skin: Secondary | ICD-10-CM | POA: Diagnosis not present

## 2021-08-18 DIAGNOSIS — Z129 Encounter for screening for malignant neoplasm, site unspecified: Secondary | ICD-10-CM | POA: Diagnosis not present

## 2021-08-18 DIAGNOSIS — L820 Inflamed seborrheic keratosis: Secondary | ICD-10-CM | POA: Diagnosis not present

## 2021-08-18 DIAGNOSIS — Z872 Personal history of diseases of the skin and subcutaneous tissue: Secondary | ICD-10-CM | POA: Diagnosis not present

## 2021-08-18 DIAGNOSIS — Z85820 Personal history of malignant melanoma of skin: Secondary | ICD-10-CM | POA: Diagnosis not present

## 2021-08-18 NOTE — Patient Instructions (Addendum)
Thank you for visiting Dr. Valeta Harms at [facility]. Today we recommend the following:  Orders Placed This Encounter  Procedures   CT Chest Wo Contrast   Return in about 1 year (around 08/19/2022) for with Eric Form, NP, or Dr. Valeta Harms. After CT chest.     Please do your part to reduce the spread of COVID-19.

## 2021-08-18 NOTE — Progress Notes (Signed)
Synopsis: Referred in July 2023 for lung nodule by Ma Rings, MD  Subjective:   PATIENT ID: Abigail Stevens GENDER: female DOB: Jul 10, 1955, MRN: [id number]  Chief Complaint  Patient presents with   Consult    CT review     This is a 66 year old female, past medical history of basal cell carcinoma right cheek, melanoma on back, depression hypothyroidism migraines.  Patient was referred after having coronary calcium CT scoring completing at the beginning of June 2023.  Coronary calcium score was 0.  However on the lung windows there was a right lower lobe pulmonary nodule few scattered nodules largest being 4 mm in size.  No follow-up was considered needed if low risk however patient does have a history of melanoma patient was referred for evaluation of pulmonary nodules.  Patient is a former smoker quit greater than 16 years ago.  From respiratory standpoint is doing well.  She is retired from [employer], [employer].  [place]    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   Depression    Hypothyroidism    Insomnia    Migraine    Neck pain    Shoulder pain      Family History  Problem Relation Age of Onset   Aneurysm Mother    Dementia Mother    Heart disease Father    Kidney disease Father    Diabetes Father    Migraines Father      Past Surgical History:  Procedure Laterality Date   APPENDECTOMY     CESAREAN SECTION     COLONOSCOPY     DEBRIDEMENT AND CLOSURE WOUND Right 11/28/2015   Procedure: COMPLEX WOUND CLOSER OF RIGHT CHEEK;  Surgeon: Crissie Reese, MD;  Location: [facility];  Service: Plastics;  Laterality: Right;   DILATION AND CURETTAGE OF UTERUS     MELANOMA EXCISION Right 11/28/2015   Procedure: MELANOMA EXCISION;  Surgeon: Crissie Reese, MD;  Location: [facility];  Service: Plastics;  Laterality: Right;   TONSILLECTOMY     TUBAL LIGATION     WISDOM TOOTH EXTRACTION       Social History   Socioeconomic History   Marital status: Single    Spouse name: Not on file   Number of children: 2   Years of education: 21   Highest education level: Not on file  Occupational History   Occupation: HR Consultant  Tobacco Use   Smoking status: Former    Types: Cigarettes   Smokeless tobacco: Never   Tobacco comments:    Quit January 2007  Substance and Sexual Activity   Alcohol use: Yes    Alcohol/week: 0.0 standard drinks of alcohol    Comment: One drink per day and 2 drinks per day on the weekends.   Drug use: No   Sexual activity: Not on file  Other Topics Concern   Not on file  Social History Narrative   Lives at home with son and daughter.   Right-handed.   1-2 cups caffeine per day.   Social Determinants of Health   Financial Resource Strain: Not on file  Food Insecurity: Not on file  Transportation Needs: Not on file  Physical Activity: Not on file  Stress: Not on file  Social Connections: Not on file  Intimate Partner Violence: Not on file     Allergies  Allergen Reactions  Codeine Nausea And Vomiting   White Petrolatum Itching    Blisters skin Blisters skin      Outpatient Medications Prior to Visit  Medication Sig Dispense Refill   ALPRAZolam (XANAX) 0.25 MG tablet as needed.     escitalopram (LEXAPRO) 10 MG tablet 10 mg daily.     Fremanezumab-vfrm (AJOVY) 225 MG/1.5ML SOAJ Inject 225 mg into the skin every 30 (thirty) days. 4.5 mL 3   levothyroxine (SYNTHROID, LEVOTHROID) 75 MCG tablet Take 75 mcg by mouth See admin instructions. Taking 32mg Mon-Fri, 37.'5mg'$  Sat-Sun.     promethazine (PHENERGAN) 25 MG tablet Take 1 tablet (25 mg total) by mouth every 6 (six) hours as needed for nausea or vomiting. #30 per month. 15 tablet 3   SUMAtriptan (IMITREX) 100 MG tablet Take 1 tab at onset of migraine.  May repeat in 2 hrs, if needed.  Max dose: 2 tabs/day or 9 tabs/month. 90-day rx. 27 tablet 3   SUMAtriptan 6 MG/0.5ML SOAJ Inject 6  mg into the skin as needed. 5 mL 1   No facility-administered medications prior to visit.    Review of Systems  Constitutional:  Negative for chills, fever, malaise/fatigue and weight loss.  HENT:  Negative for hearing loss, sore throat and tinnitus.   Eyes:  Negative for blurred vision and double vision.  Respiratory:  Negative for cough, hemoptysis, sputum production, shortness of breath, wheezing and stridor.   Cardiovascular:  Negative for chest pain, palpitations, orthopnea, leg swelling and PND.  Gastrointestinal:  Negative for abdominal pain, constipation, diarrhea, heartburn, nausea and vomiting.  Genitourinary:  Negative for dysuria, hematuria and urgency.  Musculoskeletal:  Negative for joint pain and myalgias.  Skin:  Negative for itching and rash.  Neurological:  Negative for dizziness, tingling, weakness and headaches.  Endo/Heme/Allergies:  Negative for environmental allergies. Does not bruise/bleed easily.  Psychiatric/Behavioral:  Negative for depression. The patient is not nervous/anxious and does not have insomnia.   All other systems reviewed and are negative.    Objective:  Physical Exam Vitals reviewed.  Constitutional:      General: She is not in acute distress.    Appearance: She is well-developed.  HENT:     Head: Normocephalic and atraumatic.  Eyes:     General: No scleral icterus.    Conjunctiva/sclera: Conjunctivae normal.     Pupils: Pupils are equal, round, and reactive to light.  Neck:     Vascular: No JVD.     Trachea: No tracheal deviation.  Cardiovascular:     Rate and Rhythm: Normal rate and regular rhythm.     Heart sounds: Normal heart sounds. No murmur heard. Pulmonary:     Effort: Pulmonary effort is normal. No tachypnea, accessory muscle usage or respiratory distress.     Breath sounds: No stridor. No wheezing, rhonchi or rales.  Abdominal:     General: There is no distension.     Palpations: Abdomen is soft.     Tenderness: There  is no abdominal tenderness.  Musculoskeletal:        General: No tenderness.     Cervical back: Neck supple.  Lymphadenopathy:     Cervical: No cervical adenopathy.  Skin:    General: Skin is warm and dry.     Capillary Refill: Capillary refill takes less than 2 seconds.     Findings: No rash.  Neurological:     Mental Status: She is alert and oriented to person, place, and time.  Psychiatric:  Behavior: Behavior normal.      Vitals:   08/18/21 1516  BP: 124/76  Pulse: 70  Temp: 98.1 F (36.7 C)  TempSrc: Oral  SpO2: 100%  Weight: 102 lb (46.3 kg)  Height: 5' 2.5" (1.588 m)   100% on RA BMI Readings from Last 3 Encounters:  08/18/21 18.36 kg/m  02/25/21 18.69 kg/m  04/25/20 18.60 kg/m   Wt Readings from Last 3 Encounters:  08/18/21 102 lb (46.3 kg)  02/25/21 105 lb 8 oz (47.9 kg)  04/25/20 105 lb (47.6 kg)     CBC    Component Value Date/Time   WBC 4.8 10/26/2018 1013   WBC 5.9 11/28/2015 1249   RBC 4.19 10/26/2018 1013   RBC 4.72 11/28/2015 1249   HGB 12.9 10/26/2018 1013   HCT 37.0 10/26/2018 1013   PLT 247 11/28/2015 1249   MCV 88 10/26/2018 1013   MCH 30.8 10/26/2018 1013   MCH 31.4 11/28/2015 1249   MCHC 34.9 10/26/2018 1013   MCHC 33.8 11/28/2015 1249   RDW 11.6 (L) 10/26/2018 1013   LYMPHSABS 1.6 10/26/2018 1013   EOSABS 0.1 10/26/2018 1013   BASOSABS 0.1 10/26/2018 1013      Chest Imaging:  Coronary calcium CT score CT chest lungs lower lobes: Lung windows revealed scattered lower lobe pulmonary nodules.  Largest being 4 mm in size.  These were found incidentally. The patient's images have been independently reviewed by me.    Pulmonary Functions Testing Results:     No data to display          FeNO:   Pathology:   Echocardiogram:   Heart Catheterization:     Assessment & Plan:     ICD-10-CM   1. Pulmonary nodules  R91.8 CT Chest Wo Contrast    2. History of melanoma  Z85.820 CT Chest Wo Contrast       Discussion:  This is a 66 year old female, history of pulmonary nodules found incidentally on a calcium scoring CT.  She had a history of melanoma greater than 5 years ago that was resected from the back.  No additional treatment after resection.  Plan: I reviewed her CT images with her today.  They are all small subcentimeter nodules located in the right lower lobe.  Probably malignancy is low however she has a history of melanoma. We will plan for a repeat noncontrasted CT scan of the chest in 1 year to ensure either resolution of the nodule or needed continued follow-up due to her malignancy history. Patient is agreeable to this plan. Orders for CT placed. Patient to follow-up with [place] in 1 year after repeat CT completed in June 2024.   Current Outpatient Medications:    ALPRAZolam (XANAX) 0.25 MG tablet, as needed., Disp: , Rfl:    escitalopram (LEXAPRO) 10 MG tablet, 10 mg daily., Disp: , Rfl:    Fremanezumab-vfrm (AJOVY) 225 MG/1.5ML SOAJ, Inject 225 mg into the skin every 30 (thirty) days., Disp: 4.5 mL, Rfl: 3   levothyroxine (SYNTHROID, LEVOTHROID) 75 MCG tablet, Take 75 mcg by mouth See admin instructions. Taking 45mg Mon-Fri, 37.'5mg'$  Sat-Sun., Disp: , Rfl:    promethazine (PHENERGAN) 25 MG tablet, Take 1 tablet (25 mg total) by mouth every 6 (six) hours as needed for nausea or vomiting. #30 per month., Disp: 15 tablet, Rfl: 3   SUMAtriptan (IMITREX) 100 MG tablet, Take 1 tab at onset of migraine.  May repeat in 2 hrs, if needed.  Max dose: 2 tabs/day or 9  tabs/month. 90-day rx., Disp: 27 tablet, Rfl: 3   SUMAtriptan 6 MG/0.5ML SOAJ, Inject 6 mg into the skin as needed., Disp: 5 mL, Rfl: 1   Garner Nash, DO [facility] 08/18/2021 3:35 PM

## 2021-10-20 DIAGNOSIS — H401111 Primary open-angle glaucoma, right eye, mild stage: Secondary | ICD-10-CM | POA: Diagnosis not present

## 2021-10-20 DIAGNOSIS — H40052 Ocular hypertension, left eye: Secondary | ICD-10-CM | POA: Diagnosis not present

## 2021-12-25 DIAGNOSIS — M25561 Pain in right knee: Secondary | ICD-10-CM | POA: Diagnosis not present

## 2021-12-29 DIAGNOSIS — M25561 Pain in right knee: Secondary | ICD-10-CM | POA: Diagnosis not present

## 2022-01-13 DIAGNOSIS — M810 Age-related osteoporosis without current pathological fracture: Secondary | ICD-10-CM | POA: Diagnosis not present

## 2022-01-13 DIAGNOSIS — M629 Disorder of muscle, unspecified: Secondary | ICD-10-CM | POA: Diagnosis not present

## 2022-01-13 DIAGNOSIS — Z1231 Encounter for screening mammogram for malignant neoplasm of breast: Secondary | ICD-10-CM | POA: Diagnosis not present

## 2022-01-13 DIAGNOSIS — N952 Postmenopausal atrophic vaginitis: Secondary | ICD-10-CM | POA: Diagnosis not present

## 2022-01-13 DIAGNOSIS — Z681 Body mass index (BMI) 19 or less, adult: Secondary | ICD-10-CM | POA: Diagnosis not present

## 2022-01-13 DIAGNOSIS — Z01419 Encounter for gynecological examination (general) (routine) without abnormal findings: Secondary | ICD-10-CM | POA: Diagnosis not present

## 2022-01-27 DIAGNOSIS — H401111 Primary open-angle glaucoma, right eye, mild stage: Secondary | ICD-10-CM | POA: Diagnosis not present

## 2022-01-27 DIAGNOSIS — H40052 Ocular hypertension, left eye: Secondary | ICD-10-CM | POA: Diagnosis not present

## 2022-01-27 DIAGNOSIS — Z23 Encounter for immunization: Secondary | ICD-10-CM | POA: Diagnosis not present

## 2022-02-16 DIAGNOSIS — L821 Other seborrheic keratosis: Secondary | ICD-10-CM | POA: Diagnosis not present

## 2022-02-16 DIAGNOSIS — Z85828 Personal history of other malignant neoplasm of skin: Secondary | ICD-10-CM | POA: Diagnosis not present

## 2022-02-16 DIAGNOSIS — L218 Other seborrheic dermatitis: Secondary | ICD-10-CM | POA: Diagnosis not present

## 2022-02-16 DIAGNOSIS — Z129 Encounter for screening for malignant neoplasm, site unspecified: Secondary | ICD-10-CM | POA: Diagnosis not present

## 2022-02-16 DIAGNOSIS — D485 Neoplasm of uncertain behavior of skin: Secondary | ICD-10-CM | POA: Diagnosis not present

## 2022-02-16 DIAGNOSIS — Z85820 Personal history of malignant melanoma of skin: Secondary | ICD-10-CM | POA: Diagnosis not present

## 2022-02-25 ENCOUNTER — Encounter: Payer: Self-pay | Admitting: Neurology

## 2022-02-25 ENCOUNTER — Ambulatory Visit (INDEPENDENT_AMBULATORY_CARE_PROVIDER_SITE_OTHER): Payer: Medicare Other | Admitting: Neurology

## 2022-02-25 VITALS — BP 115/74 | HR 61 | Ht 62.0 in | Wt 103.0 lb

## 2022-02-25 DIAGNOSIS — G43709 Chronic migraine without aura, not intractable, without status migrainosus: Secondary | ICD-10-CM

## 2022-02-25 MED ORDER — SUMATRIPTAN SUCCINATE 100 MG PO TABS: ORAL_TABLET | ORAL | 3 refills | Status: DC

## 2022-02-25 MED ORDER — RIZATRIPTAN BENZOATE 10 MG PO TBDP: 10.0000 mg | ORAL_TABLET | ORAL | 11 refills | Status: DC | PRN

## 2022-02-25 NOTE — Progress Notes (Signed)
Patient: ARDEN AXON Date of Birth: October 01, 1955  Reason for Visit: Follow up History from: Patient Primary Neurologist: Krista Blue  ASSESSMENT AND PLAN 67 y.o. year old female   1.  Chronic migraine headache -No longer wishes to be on preventative medication, about 4-6 headaches a month -She would like to try a dissolvable rescue option, I will send in Maxalt 10 mg melt to her local pharmacy, I will refill her Imitrex tablet to mail-order pharmacy in the event the Maxalt melt is not helpful, she is aware not to utilize both medications simultaneously -Continue Phenergan as needed for nausea with severe headache -I gave her 2 boxes of Nurtec to sample, if she likes we can see what the cost would be with insurance -Previously tried and failed Ajovy, nortriptyline, Inderal LA, Relpax, Imitrex injection -Follow-up in 1 year or sooner if needed  HISTORY OF PRESENT ILLNESS:Hennie C Pierrelouis is a 67 years old right-handed female, seen in refer by her primary care physician from [place] Dr. Jani Gravel in November 01 2014 for evaluation of frequent headaches   She reported a history of migraine headaches since 10 years old, her father also suffered typical migraines, her typical migraines are lateralized severe pounding headache was associated light noise sensitivity, nauseous, lasting for a few hours.  She has gone through a lot of stress since 2015, complains of increased headache over the past 2 years, for a while, she has been taking frequent ibuprofen, Tylenol, without helping her symptoms, now she has tapered off daily analgesic use, continue complains frequent almost daily variable degree of headaches.  She was not able to identify any clear triggers, she also woke up with severe right side retro-orbital area pounding headaches, spreading from right frontal parietal to right neck and shoulder region, 8 out of 10, she has been taking Imitrex tablet, which helps her some, but often  she has to take second dose, she used upper 9 tablet monthly supply within 2 weeks,   Over the years, she has tried different preventive medications, butterbur, Inderal 60 mg twice a day, which seems to help her some, but the benefit does not sustain, she also has tried acupuncture, massage, chiropractor, ice pack, magnesium oxide, neck stretching exercise, ergonomic approach with limited help.   She is now using frequent ice pack, Imitrex 100 mg tablets as needed, Relpax works better, but it cost 150 $ for 4 tablets   Update April 25, 2020 SS: Here today alone, has 1-2 mild migraine headaches a week, will take Imitrex tablet with good benefit. Keeps Imitrex injection as needed, usually only uses twice a year. Keeps phenergan PRN.  Had trouble getting the syringe with the Imitrex injection last refill. Retired end of January. Scared of memory loss, her mother had Dementia, can lose her train of thought. Stopped B 12 injections last year, didn't do supplement. Seeing PCP next month. Has to be so careful with migraine triggers, affects her quality of life. MMSE 30/30 today.   02/25/21 AL: DEMETRIA IWAI is a 67 y.o. female here today for follow up for migraines. She discontinued propranolol ER '60mg'$  daily and nortriptyline '50mg'$  QHS but has continued Ajovy monthly. She does not feel that migraines are any worse on Ajovy alone. Sumatriptan tablets and injections work well in abortive therapy. She may need 6-9 tablets per month. She may use sumatriptan injections twice a year. She does not take OTC analgesics. She feels that she is well managed at this time.  Update February 25, 2022 SS: She is off Ajovy, she didn't really see a difference and insurance wouldn't cover. 3-4 times a year has a severe migraine with vomiting. Within a month has to take Imitrex/Phenergan 4-6 times, some of those may be on the same day. Would like to try a melt tablet. Will use good rx. Does not want to be on daily preventative.  Enjoying retirement.   REVIEW OF SYSTEMS: Out of a complete 14 system review of symptoms, the patient complains only of the following symptoms, and all other reviewed systems are negative.  See HPI  ALLERGIES: Allergies  Allergen Reactions   Codeine Nausea And Vomiting   White Petrolatum Itching    Blisters skin Blisters skin     HOME MEDICATIONS: Outpatient Medications Prior to Visit  Medication Sig Dispense Refill   alendronate (FOSAMAX) 70 MG tablet Take 70 mg by mouth once a week. Take with a full glass of water on an empty stomach.     ALPRAZolam (XANAX) 0.25 MG tablet as needed.     escitalopram (LEXAPRO) 10 MG tablet 10 mg daily.     levothyroxine (SYNTHROID, LEVOTHROID) 75 MCG tablet Take 75 mcg by mouth See admin instructions. Taking 41mg Mon-Fri, 37.'5mg'$  Sat-Sun.     meloxicam (MOBIC) 7.5 MG tablet Take 7.5 mg by mouth as needed for pain.     promethazine (PHENERGAN) 25 MG tablet Take 1 tablet (25 mg total) by mouth every 6 (six) hours as needed for nausea or vomiting. #30 per month. 15 tablet 3   Travoprost, BAK Free, (TRAVATAN) 0.004 % SOLN ophthalmic solution Place 1 drop into both eyes at bedtime.     tretinoin (RETIN-A) 0.1 % cream Apply topically at bedtime.     SUMAtriptan (IMITREX) 100 MG tablet Take 1 tab at onset of migraine.  May repeat in 2 hrs, if needed.  Max dose: 2 tabs/day or 9 tabs/month. 90-day rx. 27 tablet 3   SUMAtriptan 6 MG/0.5ML SOAJ Inject 6 mg into the skin as needed. (Patient not taking: Reported on 02/25/2022) 5 mL 1   Fremanezumab-vfrm (AJOVY) 225 MG/1.5ML SOAJ Inject 225 mg into the skin every 30 (thirty) days. (Patient not taking: Reported on 02/25/2022) 4.5 mL 3   No facility-administered medications prior to visit.    PAST MEDICAL HISTORY: Past Medical History:  Diagnosis Date   Anxiety    [place]cer ([place]    basal cell carcinoma  right cheek and melanoma on back   Depression    Hypothyroidism    Insomnia    Migraine    Neck pain     Shoulder pain     PAST SURGICAL HISTORY: Past Surgical History:  Procedure Laterality Date   APPENDECTOMY     CESAREAN SECTION     COLONOSCOPY     DEBRIDEMENT AND CLOSURE WOUND Right 11/28/2015   Procedure: COMPLEX WOUND CLOSER OF RIGHT CHEEK;  Surgeon: DCrissie Reese MD;  Location[facility]  Service: Plastics;  Laterality: Right;   DILATION AND CURETTAGE OF UTERUS     MELANOMA EXCISION Right 11/28/2015   Procedure: MELANOMA EXCISION;  Surgeon: DCrissie Reese MD;  Location: [facility]  Service: Plastics;  Laterality: Right;   TONSILLECTOMY     TUBAL LIGATION     WISDOM TOOTH EXTRACTION     FAMILY HISTORY: Family History  Problem Relation Age of Onset   Aneurysm Mother    Dementia Mother    Heart disease Father    Kidney disease Father  Diabetes Father    Migraines Father    SOCIAL HISTORY: Social History   Socioeconomic History   Marital status: Single    Spouse name: Not on file   Number of children: 2   Years of education: 16   Highest education level: Not on file  Occupational History   Occupation: HR Consultant  Tobacco Use   Smoking status: Former    Types: Cigarettes   Smokeless tobacco: Never   Tobacco comments:    Quit January 2007  Substance and Sexual Activity   Alcohol use: Yes    Alcohol/week: 0.0 standard drinks of alcohol    Comment: One drink per day and 2 drinks per day on the weekends.   Drug use: No   Sexual activity: Not on file  Other Topics Concern   Not on file  Social History Narrative   Lives at home with son and daughter.   Right-handed.   1-2 cups caffeine per day.   Social Determinants of Health   Financial Resource Strain: Not on file  Food Insecurity: Not on file  Transportation Needs: Not on file  Physical Activity: Not on file  Stress: Not on file  Social Connections: Not on file  Intimate Partner Violence: Not on file   PHYSICAL EXAM  Vitals:   02/25/22 1038  BP: 115/74  Pulse: 61  Weight: 103 lb (46.7 kg)  Height:  '5\' 2"'$  (1.575 m)   Body mass index is 18.84 kg/m.  Generalized: Well developed, in no acute distress  Neurological examination  Mentation: Alert oriented to time, place, history taking. Follows all commands speech and language fluent Cranial nerve II-XII: Pupils were equal round reactive to light. Extraocular movements were full, visual field were full on confrontational test. Facial sensation and strength were normal.  Head turning and shoulder shrug  were normal and symmetric. Motor: The motor testing reveals 5 over 5 strength of all 4 extremities. Good symmetric motor tone is noted throughout.  Sensory: Sensory testing is intact to soft touch on all 4 extremities. No evidence of extinction is noted.  Coordination: Cerebellar testing reveals good finger-nose-finger and heel-to-shin bilaterally.  Gait and station: Gait is normal.  Reflexes: Deep tendon reflexes are symmetric and normal bilaterally.   DIAGNOSTIC DATA (LABS, IMAGING, TESTING) - I reviewed patient records, labs, notes, testing and imaging myself where available.  Lab Results  Component Value Date   WBC 4.8 10/26/2018   HGB 12.9 10/26/2018   HCT 37.0 10/26/2018   MCV 88 10/26/2018   PLT 247 11/28/2015      Component Value Date/Time   NA 140 10/26/2018 1013   K 5.2 10/26/2018 1013   CL 101 10/26/2018 1013   CO2 25 10/26/2018 1013   GLUCOSE 94 10/26/2018 1013   GLUCOSE 78 11/28/2015 1249   BUN 14 10/26/2018 1013   CREATININE 0.97 10/26/2018 1013   CALCIUM 9.4 10/26/2018 1013   PROT 6.7 10/26/2018 1013   ALBUMIN 4.6 10/26/2018 1013   AST 29 10/26/2018 1013   ALT 12 10/26/2018 1013   ALKPHOS 56 10/26/2018 1013   BILITOT 0.4 10/26/2018 1013   GFRNONAA 63 10/26/2018 1013   GFRAA 72 10/26/2018 1013   No results found for: "CHOL", "HDL", "LDLCALC", "LDLDIRECT", "TRIG", "CHOLHDL" No results found for: "HGBA1C" Lab Results  Component Value Date   VITAMINB12 230 (L) 10/26/2018   Lab Results  Component Value Date    TSH 0.476 10/26/2018    Butler Denmark, AGNP-C, DNP 02/25/2022, 11:17 AM [facility] [address], [place], [place] [address] [phone]

## 2022-02-25 NOTE — Patient Instructions (Signed)
Meds ordered this encounter  Medications   rizatriptan (MAXALT-MLT) 10 MG disintegrating tablet    Sig: Take 1 tablet (10 mg total) by mouth as needed for migraine. May repeat in 2 hours if needed    Dispense:  9 tablet    Refill:  11   SUMAtriptan (IMITREX) 100 MG tablet    Sig: Take 1 tab at onset of migraine.  May repeat in 2 hrs, if needed.  Max dose: 2 tabs/day or 9 tabs/month. 90-day rx.    Dispense:  27 tablet    Refill:  3    Max of #2/day or #9/month.   I will send in [place] melt to try for acute headache  I will also give you sample of nurtec I wouldn't recommend taking Imitrex and Maxalt at the same time See you back in 1 year

## 2022-04-16 ENCOUNTER — Telehealth: Payer: Self-pay | Admitting: Internal Medicine

## 2022-04-16 NOTE — Telephone Encounter (Signed)
PT called for appt for FU w/Wert in July. CT scan. I am not sure how PCC's know when to set up CT's but I told her I'd let you know she needs a CT scan about the end of June for an early July FU.   AVS notes:   CT Chest Wo Contrast    Thanks ladies.

## 2022-04-16 NOTE — Telephone Encounter (Signed)
Left message in detail  for the patient letting her know we will call about a month ahead of time

## 2022-06-16 ENCOUNTER — Other Ambulatory Visit: Payer: Self-pay | Admitting: Family Medicine

## 2022-06-16 DIAGNOSIS — E039 Hypothyroidism, unspecified: Secondary | ICD-10-CM | POA: Diagnosis not present

## 2022-06-16 DIAGNOSIS — E7800 Pure hypercholesterolemia, unspecified: Secondary | ICD-10-CM | POA: Diagnosis not present

## 2022-06-16 DIAGNOSIS — Z0000 Encounter for general adult medical examination without abnormal findings: Secondary | ICD-10-CM | POA: Diagnosis not present

## 2022-06-16 DIAGNOSIS — R911 Solitary pulmonary nodule: Secondary | ICD-10-CM

## 2022-06-16 DIAGNOSIS — E538 Deficiency of other specified B group vitamins: Secondary | ICD-10-CM | POA: Diagnosis not present

## 2022-06-16 DIAGNOSIS — Z85820 Personal history of malignant melanoma of skin: Secondary | ICD-10-CM | POA: Diagnosis not present

## 2022-06-16 DIAGNOSIS — H40052 Ocular hypertension, left eye: Secondary | ICD-10-CM | POA: Diagnosis not present

## 2022-06-16 DIAGNOSIS — Z79899 Other long term (current) drug therapy: Secondary | ICD-10-CM | POA: Diagnosis not present

## 2022-06-16 DIAGNOSIS — H401111 Primary open-angle glaucoma, right eye, mild stage: Secondary | ICD-10-CM | POA: Diagnosis not present

## 2022-07-22 ENCOUNTER — Ambulatory Visit
Admission: RE | Admit: 2022-07-22 | Discharge: 2022-07-22 | Disposition: A | Discharge status: Home / Self Care | Payer: Medicare Other | Source: Ambulatory Visit | Attending: Family Medicine | Admitting: Family Medicine

## 2022-07-22 DIAGNOSIS — R911 Solitary pulmonary nodule: Secondary | ICD-10-CM | POA: Diagnosis not present

## 2022-07-27 ENCOUNTER — Telehealth: Payer: Self-pay | Admitting: Pulmonary Disease

## 2022-07-27 NOTE — Telephone Encounter (Signed)
Patient would like to know if she needs CT scan. Also the appointment with Kandice Robinsons NP. Patient phone number is [phone].

## 2022-07-28 NOTE — Telephone Encounter (Signed)
Pt just had a Ct last week 6/12. Would the pt still need another scan ? Please advise

## 2022-07-29 NOTE — Telephone Encounter (Signed)
Called and spoke with the pt to inform her I would be canceling her CT. Pt also wanted her appointment with sarah to be canceled as well due to her being out of town. Nothing further needed

## 2022-08-07 ENCOUNTER — Ambulatory Visit (HOSPITAL_COMMUNITY): Payer: Medicare Other

## 2022-08-11 ENCOUNTER — Ambulatory Visit: Payer: Medicare Other | Admitting: Acute Care

## 2022-08-16 IMAGING — CT CT CARDIAC CORONARY ARTERY CALCIUM SCORE
3 series · 14 of 20 positions shown, 16 images · non-contrast
Comparison: None Available.
COMPARISON: None Available.

Addendum:
EXAM:
OVER-READ INTERPRETATION  CT CHEST

The following report is a limited chest CT over-read performed by
This over-read does not include interpretation of cardiac or
coronary anatomy or pathology. The coronary calcium interpretation
by the cardiologist is attached.
CLINICAL DATA: 65F for cardiovascular disease risk stratification
Coronary Calcium Score
TECHNIQUE: A gated, non-contrast computed tomography scan of the heart was
performed using 3mm slice thickness. Axial images were analyzed on a
dedicated workstation. Calcium scoring of the coronary arteries was
performed using the Agatston method.

[Series 2: ax lung · axial · 0.64mm/px · z∈[-135,-23]mm · 5 of 86 slices shown]
[im 15/86  lung]
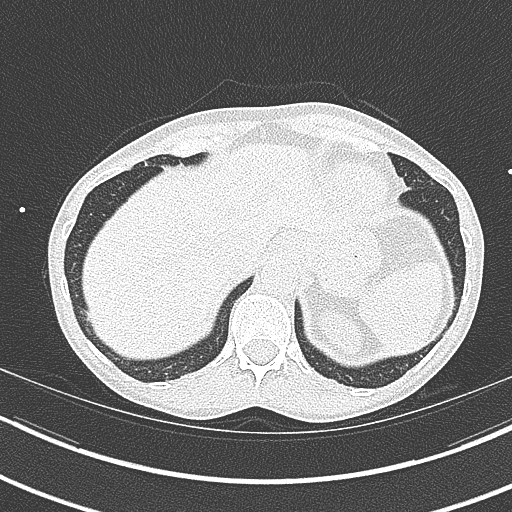
[im 29/86  lung]
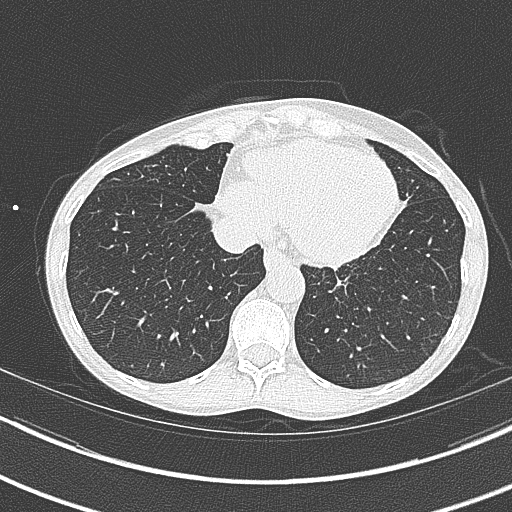
[im 43/86  lung]
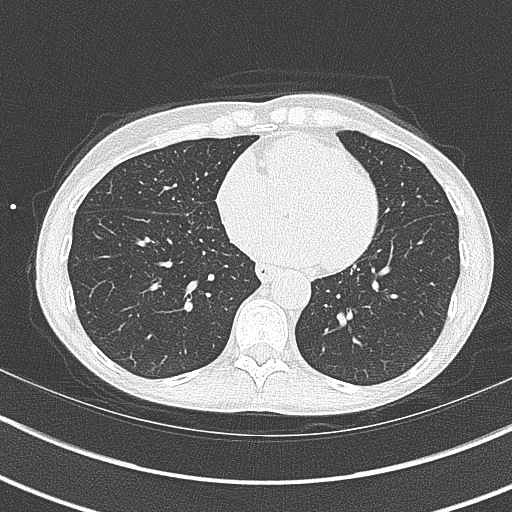
[im 57/86  lung]
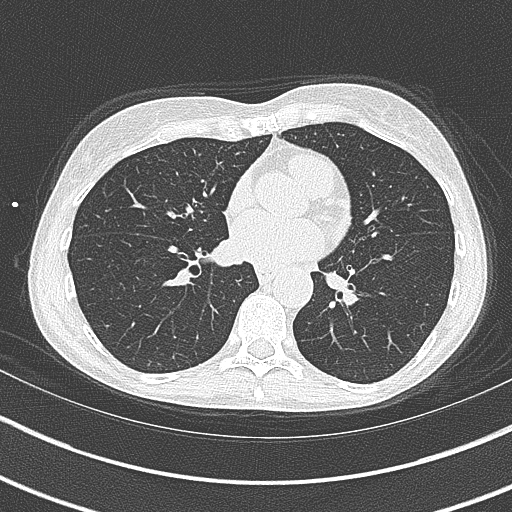
[im 71/86  lung]
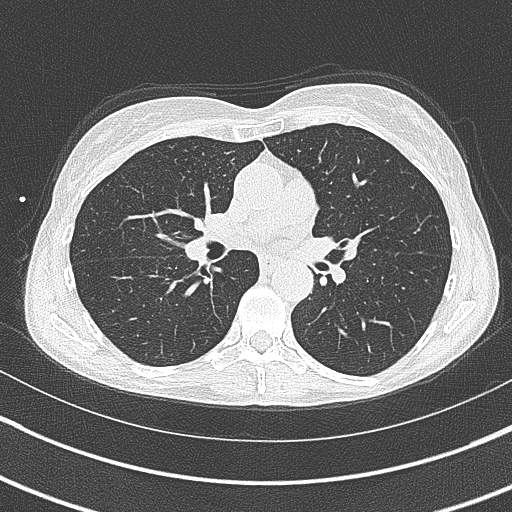

[Series 3: cascseq 3.0 sa36 70% (id) · axial · 0.28mm/px · z∈[-119,-35]mm · 3 of 57 slices shown]
[im 15/57  vessel]
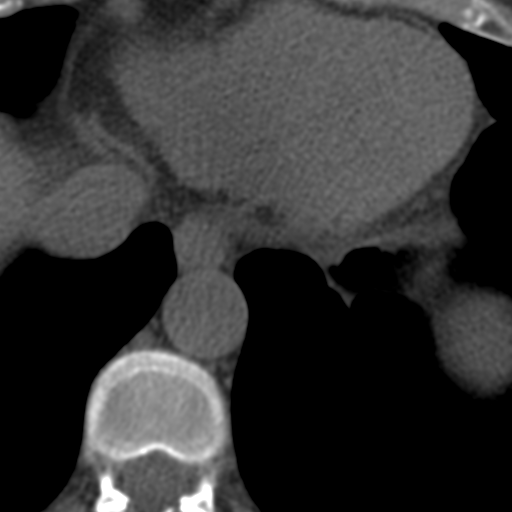
[im 29/57  vessel]
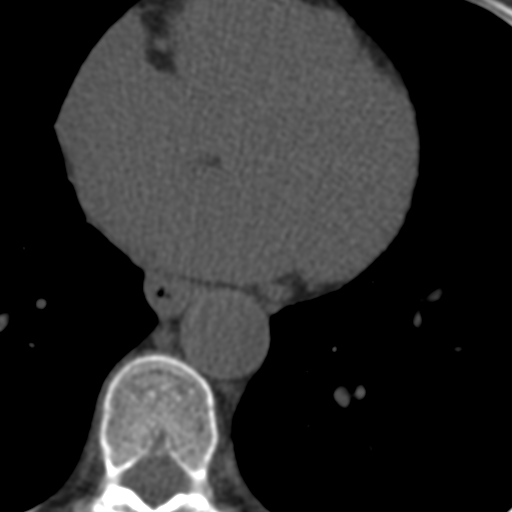
[im 43/57  vessel]
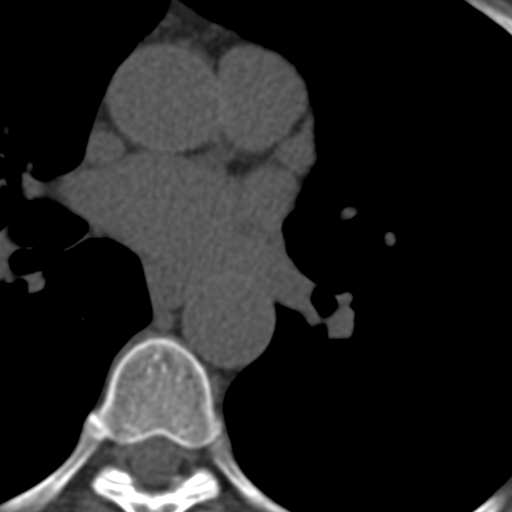

[Series 4: ax st · axial · 0.62mm/px · z∈[-139,-19]mm · 6 of 86 slices shown, 8 images]
[im 13/86  vessel]
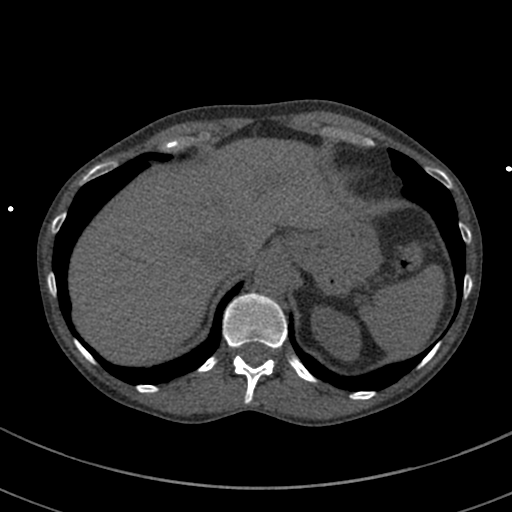
[im 13/86  lung]
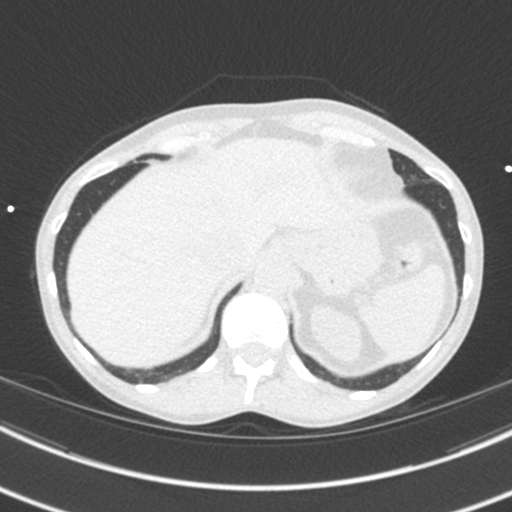
[im 25/86  vessel]
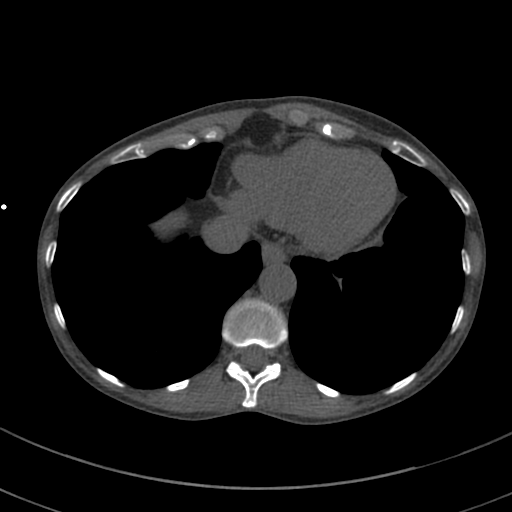
[im 37/86  vessel]
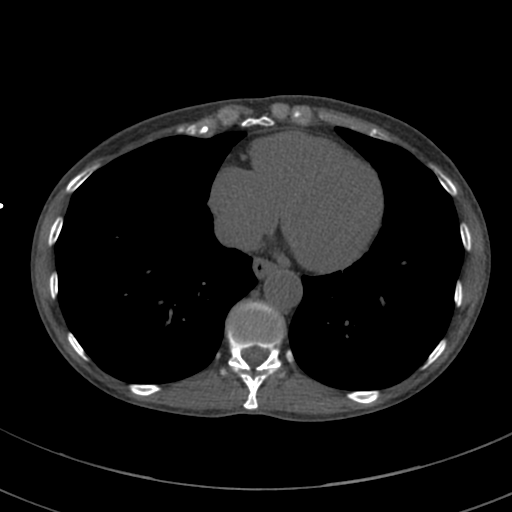
[im 49/86  vessel]
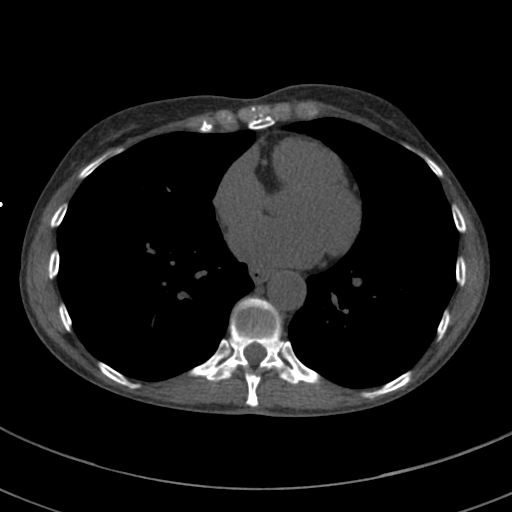
[im 61/86  vessel]
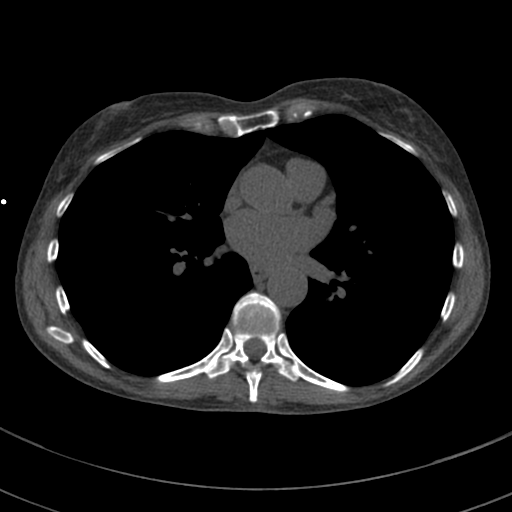
[im 61/86  lung]
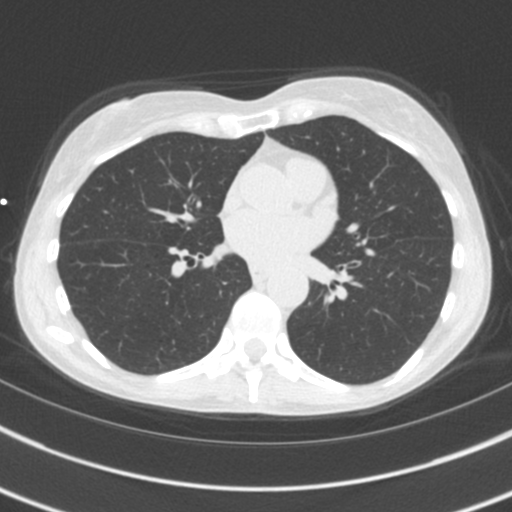
[im 73/86  vessel]
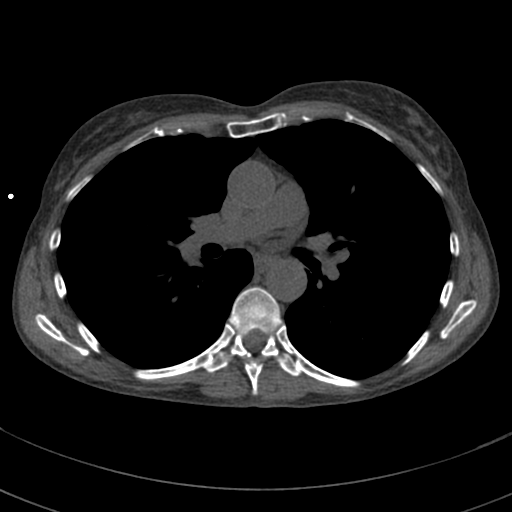

[14 of 20 positions shown; findings below may reference images not displayed]

FINDINGS: Vascular: No significant extracardiac vascular findings.

Mediastinum/Nodes: No lymphadenopathy.

Lungs/Pleura: No focal airspace disease. Clustered nodularity in the
anterior periphery of the right lower lobe, large nodule measures 4
mm (series 2, images 49-51). There is in additional solid 4 mm right
lower lobe pulmonary nodule (series 2, image 24).

Upper Abdomen: No acute abnormality.

Musculoskeletal: No acute osseous abnormality. No suspicious lytic
or blastic lesions.
IMPRESSION: Clustered nodularity anterior periphery of the right lower lobe,
largest nodule measuring 4 mm, potentially infectious/inflammatory.
Additional solid 4 mm nodule in the right lower lobe. No follow-up
needed if patient is low-risk (and has no known or suspected primary
neoplasm). Non-contrast chest CT can be considered in 12 months if
patient is high-risk. This recommendation follows the consensus
statement: Guidelines for Management of Incidental Pulmonary Nodules
Detected on CT Images: From the [HOSPITAL] 9378; Radiology
FINDINGS: Coronary arteries: Normal origins.

Coronary Calcium Score: 0

Pericardium: Normal.

Ascending Aorta: Normal caliber.  2.9 cm.

Non-cardiac: See separate report from [REDACTED].
IMPRESSION: Coronary calcium score of 0. This was 0



If CAC=0, it is reasonable to withhold statin therapy and reassess
in 5 to 10 years, as long as higher risk conditions are absent
(diabetes mellitus, family history of premature CHD in first degree
relatives (males <55 years; females <65 years), cigarette smoking,
or LDL >=190 mg/dL).

If CAC is 1 to 99, it is reasonable to initiate statin therapy for
patients >=55 years of age.

If CAC is >=100 or >=75th percentile, it is reasonable to initiate
statin therapy at any age.

Cardiology referral should be considered for patients with CAC
scores >=400 or >=75th percentile.

*7291 AHA/ACC/AACVPR/AAPA/ABC/GENERAL/ROSA M/KUBELE/Mania/TAMI/MARIGLEN FERI/RINK
Guideline on the Management of Blood Cholesterol: A Report of the
American College of Cardiology/American Heart Association Task Force
on Clinical Practice Guidelines. J Am Coll Cardiol.
9900;73(24):8056-8485.

*** End of Addendum ***
EXAM:
OVER-READ INTERPRETATION  CT CHEST

The following report is a limited chest CT over-read performed by
This over-read does not include interpretation of cardiac or
coronary anatomy or pathology. The coronary calcium interpretation
by the cardiologist is attached.
FINDINGS: Vascular: No significant extracardiac vascular findings.

Mediastinum/Nodes: No lymphadenopathy.

Lungs/Pleura: No focal airspace disease. Clustered nodularity in the
anterior periphery of the right lower lobe, large nodule measures 4
mm (series 2, images 49-51). There is in additional solid 4 mm right
lower lobe pulmonary nodule (series 2, image 24).

Upper Abdomen: No acute abnormality.

Musculoskeletal: No acute osseous abnormality. No suspicious lytic
or blastic lesions.
IMPRESSION: Clustered nodularity anterior periphery of the right lower lobe,
largest nodule measuring 4 mm, potentially infectious/inflammatory.
Additional solid 4 mm nodule in the right lower lobe. No follow-up
needed if patient is low-risk (and has no known or suspected primary
neoplasm). Non-contrast chest CT can be considered in 12 months if
patient is high-risk. This recommendation follows the consensus
statement: Guidelines for Management of Incidental Pulmonary Nodules
Detected on CT Images: From the [HOSPITAL] 9378; Radiology

## 2022-08-26 DIAGNOSIS — Z129 Encounter for screening for malignant neoplasm, site unspecified: Secondary | ICD-10-CM | POA: Diagnosis not present

## 2022-08-26 DIAGNOSIS — Z85828 Personal history of other malignant neoplasm of skin: Secondary | ICD-10-CM | POA: Diagnosis not present

## 2022-08-26 DIAGNOSIS — D485 Neoplasm of uncertain behavior of skin: Secondary | ICD-10-CM | POA: Diagnosis not present

## 2022-08-26 DIAGNOSIS — L910 Hypertrophic scar: Secondary | ICD-10-CM | POA: Diagnosis not present

## 2022-08-26 DIAGNOSIS — L821 Other seborrheic keratosis: Secondary | ICD-10-CM | POA: Diagnosis not present

## 2022-09-22 DIAGNOSIS — H40052 Ocular hypertension, left eye: Secondary | ICD-10-CM | POA: Diagnosis not present

## 2022-09-22 DIAGNOSIS — H401111 Primary open-angle glaucoma, right eye, mild stage: Secondary | ICD-10-CM | POA: Diagnosis not present

## 2022-12-29 DIAGNOSIS — H401111 Primary open-angle glaucoma, right eye, mild stage: Secondary | ICD-10-CM | POA: Diagnosis not present

## 2022-12-29 DIAGNOSIS — H40052 Ocular hypertension, left eye: Secondary | ICD-10-CM | POA: Diagnosis not present

## 2023-01-18 DIAGNOSIS — Z124 Encounter for screening for malignant neoplasm of cervix: Secondary | ICD-10-CM | POA: Diagnosis not present

## 2023-01-18 DIAGNOSIS — N958 Other specified menopausal and perimenopausal disorders: Secondary | ICD-10-CM | POA: Diagnosis not present

## 2023-01-18 DIAGNOSIS — Z1231 Encounter for screening mammogram for malignant neoplasm of breast: Secondary | ICD-10-CM | POA: Diagnosis not present

## 2023-01-18 DIAGNOSIS — Z681 Body mass index (BMI) 19 or less, adult: Secondary | ICD-10-CM | POA: Diagnosis not present

## 2023-01-18 DIAGNOSIS — N952 Postmenopausal atrophic vaginitis: Secondary | ICD-10-CM | POA: Diagnosis not present

## 2023-01-18 DIAGNOSIS — M8588 Other specified disorders of bone density and structure, other site: Secondary | ICD-10-CM | POA: Diagnosis not present

## 2023-01-18 DIAGNOSIS — M629 Disorder of muscle, unspecified: Secondary | ICD-10-CM | POA: Diagnosis not present

## 2023-01-26 DIAGNOSIS — H401111 Primary open-angle glaucoma, right eye, mild stage: Secondary | ICD-10-CM | POA: Diagnosis not present

## 2023-01-26 DIAGNOSIS — H40052 Ocular hypertension, left eye: Secondary | ICD-10-CM | POA: Diagnosis not present

## 2023-02-15 DIAGNOSIS — K080 Exfoliation of teeth due to systemic causes: Secondary | ICD-10-CM | POA: Diagnosis not present

## 2023-02-22 DIAGNOSIS — H40052 Ocular hypertension, left eye: Secondary | ICD-10-CM | POA: Diagnosis not present

## 2023-02-22 DIAGNOSIS — H401111 Primary open-angle glaucoma, right eye, mild stage: Secondary | ICD-10-CM | POA: Diagnosis not present

## 2023-03-02 NOTE — Progress Notes (Unsigned)
Patient: Abigail Stevens Date of Birth: [birth year]  Reason for Visit: Follow up History from: Patient Primary Neurologist: [facility]  Virtual Visit via Video Note  I connected with Abigail Stevens on 03/03/23 at 10:45 AM EST by a video enabled telemedicine application and verified that I am speaking with the correct person using two identifiers.  Location: Patient: at her home Provider: in the office    I discussed the limitations of evaluation and management by telemedicine and the availability of in person appointments. The patient expressed understanding and agreed to proceed.  ASSESSMENT AND PLAN 68 y.o. year old female   1.  Chronic migraine headache -Continues to do overall well, wishes to continue to remain off preventative medication -Will continue Maxalt 10 mg melt, also will refill Imitrex 100 mg tablet.  She alternates how she uses both of these medications. -Try Zofran ODT for faster nausea relief versus Phenergan -Has sample of Nurtec to try -Previously tried and failed Ajovy, nortriptyline, Inderal LA, Relpax, Imitrex injection -Follow-up in 1 year or sooner if needed  Meds ordered this encounter  Medications   ondansetron (ZOFRAN-ODT) 4 MG disintegrating tablet    Sig: Take 1 tablet (4 mg total) by mouth every 8 (eight) hours as needed for nausea or vomiting.    Dispense:  20 tablet    Refill:  3   SUMAtriptan (IMITREX) 100 MG tablet    Sig: Take 1 tab at onset of migraine.  May repeat in 2 hrs, if needed.  Max dose: 2 tabs/day or 9 tabs/month. 90-day rx.    Dispense:  27 tablet    Refill:  3    Max of #2/day or #9/month.   rizatriptan (MAXALT-MLT) 10 MG disintegrating tablet    Sig: Take 1 tablet (10 mg total) by mouth as needed for migraine. May repeat in 2 hours if needed    Dispense:  9 tablet    Refill:  11   HISTORY OF PRESENT ILLNESS:Abigail Stevens is a 68 years old right-handed female, seen in refer by her primary care physician from [facility] Dr. Pearson Grippe in November 01 2014 for evaluation of frequent headaches   She reported a history of migraine headaches since 20 years old, her father also suffered typical migraines, her typical migraines are lateralized severe pounding headache was associated light noise sensitivity, nauseous, lasting for a few hours.  She has gone through a lot of stress since 2015, complains of increased headache over the past 2 years, for a while, she has been taking frequent ibuprofen, Tylenol, without helping her symptoms, now she has tapered off daily analgesic use, continue complains frequent almost daily variable degree of headaches.  She was not able to identify any clear triggers, she also woke up with severe right side retro-orbital area pounding headaches, spreading from right frontal parietal to right neck and shoulder region, 8 out of 10, she has been taking Imitrex tablet, which helps her some, but often she has to take second dose, she used upper 9 tablet monthly supply within 2 weeks,   Over the years, she has tried different preventive medications, butterbur, Inderal 60 mg twice a day, which seems to help her some, but the benefit does not sustain, she also has tried acupuncture, massage, chiropractor, ice pack, magnesium oxide, neck stretching exercise, ergonomic approach with limited help.   She is now using frequent ice pack, Imitrex 100 mg tablets as needed, Relpax works better, but it cost 150 $  for 4 tablets   Update April 25, 2020 SS: Here today alone, has 1-2 mild migraine headaches a week, will take Imitrex tablet with good benefit. Keeps Imitrex injection as needed, usually only uses twice a year. Keeps phenergan PRN.  Had trouble getting the syringe with the Imitrex injection last refill. Retired end of January. Scared of memory loss, her mother had Dementia, can lose her train of thought. Stopped B 12 injections last year, didn't do supplement. Seeing PCP next month. Has to be so  careful with migraine triggers, affects her quality of life. MMSE 30/30 today.   02/25/21 AL: Abigail Stevens is a 68 y.o. female here today for follow up for migraines. She discontinued propranolol ER 60mg  daily and nortriptyline 50mg  QHS but has continued Ajovy monthly. She does not feel that migraines are any worse on Ajovy alone. Sumatriptan tablets and injections work well in abortive therapy. She may need 6-9 tablets per month. She may use sumatriptan injections twice a year. She does not take OTC analgesics. She feels that she is well managed at this time.    Update February 25, 2022 SS: She is off Ajovy, she didn't really see a difference and insurance wouldn't cover. 3-4 times a year has a severe migraine with vomiting. Within a month has to take Imitrex/Phenergan 4-6 times, some of those may be on the same day. Would like to try a melt tablet. Will use good rx. Does not want to be on daily preventative. Enjoying retirement.   Update March 03, 2023 SS: Via VV, doing great. Gets 8 migraines a year. Takes triptan once every 2 weeks at onset of headache. Will take Maxalt melt if really nauseated, otherwise take the Imitrex. Has phenergan when needed but doesn't take often, often too late at that point. Has not tried the Nurtec.  REVIEW OF SYSTEMS: Out of a complete 14 system review of symptoms, the patient complains only of the following symptoms, and all other reviewed systems are negative.  See HPI  ALLERGIES: Allergies  Allergen Reactions   Codeine Nausea And Vomiting   White Petrolatum Itching    Blisters skin Blisters skin     HOME MEDICATIONS: Outpatient Medications Prior to Visit  Medication Sig Dispense Refill   alendronate (FOSAMAX) 70 MG tablet Take 70 mg by mouth once a week. Take with a full glass of water on an empty stomach.     ALPRAZolam (XANAX) 0.25 MG tablet as needed.     escitalopram (LEXAPRO) 10 MG tablet 10 mg daily.     levothyroxine (SYNTHROID, LEVOTHROID) 75  MCG tablet Take 75 mcg by mouth See admin instructions. Taking Mon-Fri, 37.5mg  Sat-Sun.     meloxicam (MOBIC) 7.5 MG tablet Take 7.5 mg by mouth as needed for pain.     promethazine (PHENERGAN) 25 MG tablet Take 1 tablet (25 mg total) by mouth every 6 (six) hours as needed for nausea or vomiting. #30 per month. 15 tablet 3   rizatriptan (MAXALT-MLT) 10 MG disintegrating tablet Take 1 tablet (10 mg total) by mouth as needed for migraine. May repeat in 2 hours if needed 9 tablet 11   SUMAtriptan (IMITREX) 100 MG tablet Take 1 tab at onset of migraine.  May repeat in 2 hrs, if needed.  Max dose: 2 tabs/day or 9 tabs/month. 90-day rx. 27 tablet 3   SUMAtriptan 6 MG/0.5ML SOAJ Inject 6 mg into the skin as needed. (Patient not taking: Reported on 02/25/2022) 5 mL 1   Travoprost,  BAK Free, (TRAVATAN) 0.004 % SOLN ophthalmic solution Place 1 drop into both eyes at bedtime.     tretinoin (RETIN-A) 0.1 % cream Apply topically at bedtime.     No facility-administered medications prior to visit.    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   Depression    Hypothyroidism    Insomnia    Migraine    Neck pain    Shoulder pain     PAST SURGICAL HISTORY: Past Surgical History:  Procedure Laterality Date   APPENDECTOMY     CESAREAN SECTION     COLONOSCOPY     DEBRIDEMENT AND CLOSURE WOUND Right 11/28/2015   Procedure: COMPLEX WOUND CLOSER OF RIGHT CHEEK;  Surgeon: Etter Sjogren, MD;  Location: [facility] OR;  Service: Plastics;  Laterality: Right;   DILATION AND CURETTAGE OF UTERUS     MELANOMA EXCISION Right 11/28/2015   Procedure: MELANOMA EXCISION;  Surgeon: Etter Sjogren, MD;  Location: [facility] OR;  Service: Plastics;  Laterality: Right;   TONSILLECTOMY     TUBAL LIGATION     WISDOM TOOTH EXTRACTION     FAMILY HISTORY: Family History  Problem Relation Age of Onset   Aneurysm Mother    Dementia  Mother    Heart disease Father    Kidney disease Father    Diabetes Father    Migraines Father    SOCIAL HISTORY: Social History   Socioeconomic History   Marital status: Single    Spouse name: Not on file   Number of children: 2   Years of education: 51   Highest education level: Not on file  Occupational History   Occupation: HR Consultant  Tobacco Use   Smoking status: Former    Types: Cigarettes   Smokeless tobacco: Never   Tobacco comments:    Quit January 2007  Substance and Sexual Activity   Alcohol use: Yes    Alcohol/week: 0.0 standard drinks of alcohol    Comment: One drink per day and 2 drinks per day on the weekends.   Drug use: No   Sexual activity: Not on file  Other Topics Concern   Not on file  Social History Narrative   Lives at home with son and daughter.   Right-handed.   1-2 cups caffeine per day.   Social Drivers of Corporate investment banker Strain: Not on file  Food Insecurity: Not on file  Transportation Needs: Not on file  Physical Activity: Not on file  Stress: Not on file  Social Connections: Not on file  Intimate Partner Violence: Not on file   PHYSICAL EXAM  There were no vitals filed for this visit.  There is no height or weight on file to calculate BMI.  Generalized: Well developed, in no acute distress  Via video visit, is alert and oriented, speech is clear and concise, moves about freely   DIAGNOSTIC DATA (LABS, IMAGING, TESTING) - I reviewed patient records, labs, notes, testing and imaging myself where available.  Lab Results  Component Value Date   WBC 4.8 10/26/2018   HGB 12.9 10/26/2018   HCT 37.0 10/26/2018   MCV 88 10/26/2018   PLT 247 11/28/2015      Component Value Date/Time   NA 140 10/26/2018 1013   K 5.2 10/26/2018 1013   CL 101 10/26/2018 1013   CO2 25 10/26/2018 1013  GLUCOSE 94 10/26/2018 1013   GLUCOSE 78 11/28/2015 1249   BUN 14 10/26/2018 1013   CREATININE 0.97 10/26/2018 1013   CALCIUM  9.4 10/26/2018 1013   PROT 6.7 10/26/2018 1013   ALBUMIN 4.6 10/26/2018 1013   AST 29 10/26/2018 1013   ALT 12 10/26/2018 1013   ALKPHOS 56 10/26/2018 1013   BILITOT 0.4 10/26/2018 1013   GFRNONAA 63 10/26/2018 1013   GFRAA 72 10/26/2018 1013   No results found for: "CHOL", "HDL", "LDLCALC", "LDLDIRECT", "TRIG", "CHOLHDL" No results found for: "HGBA1C" Lab Results  Component Value Date   VITAMINB12 230 (L) 10/26/2018   Lab Results  Component Value Date   TSH 0.476 10/26/2018    Margie Ege, AGNP-C, DNP 03/02/2023, 2:55 PM [facility] [address] [place], [place] [address] [phone]

## 2023-03-03 ENCOUNTER — Telehealth (INDEPENDENT_AMBULATORY_CARE_PROVIDER_SITE_OTHER): Payer: Medicare Other | Admitting: Neurology

## 2023-03-03 DIAGNOSIS — G43709 Chronic migraine without aura, not intractable, without status migrainosus: Secondary | ICD-10-CM

## 2023-03-03 MED ORDER — ONDANSETRON 4 MG PO TBDP: 4.0000 mg | ORAL_TABLET | Freq: Three times a day (TID) | ORAL | 3 refills | Status: AC | PRN

## 2023-03-03 MED ORDER — RIZATRIPTAN BENZOATE 10 MG PO TBDP: 10.0000 mg | ORAL_TABLET | ORAL | 11 refills | Status: DC | PRN

## 2023-03-03 MED ORDER — SUMATRIPTAN SUCCINATE 100 MG PO TABS: ORAL_TABLET | ORAL | 3 refills | Status: DC

## 2023-03-03 NOTE — Patient Instructions (Signed)
Great to see you today.  We will continue current medications.  If your headaches increase and you wish to go back on preventative please let me know.  I will send in a dissolvable option of Zofran to try for nausea.  Please feel free to reach out if you need anything.  I will see you back in 1 year.  Thanks!!

## 2023-03-24 DIAGNOSIS — L821 Other seborrheic keratosis: Secondary | ICD-10-CM | POA: Diagnosis not present

## 2023-03-24 DIAGNOSIS — L910 Hypertrophic scar: Secondary | ICD-10-CM | POA: Diagnosis not present

## 2023-03-24 DIAGNOSIS — L578 Other skin changes due to chronic exposure to nonionizing radiation: Secondary | ICD-10-CM | POA: Diagnosis not present

## 2023-03-24 DIAGNOSIS — Z85828 Personal history of other malignant neoplasm of skin: Secondary | ICD-10-CM | POA: Diagnosis not present

## 2023-03-24 DIAGNOSIS — Z85820 Personal history of malignant melanoma of skin: Secondary | ICD-10-CM | POA: Diagnosis not present

## 2023-06-08 DIAGNOSIS — K080 Exfoliation of teeth due to systemic causes: Secondary | ICD-10-CM | POA: Diagnosis not present

## 2023-06-30 DIAGNOSIS — E559 Vitamin D deficiency, unspecified: Secondary | ICD-10-CM | POA: Diagnosis not present

## 2023-06-30 DIAGNOSIS — E039 Hypothyroidism, unspecified: Secondary | ICD-10-CM | POA: Diagnosis not present

## 2023-06-30 DIAGNOSIS — E538 Deficiency of other specified B group vitamins: Secondary | ICD-10-CM | POA: Diagnosis not present

## 2023-06-30 DIAGNOSIS — E7800 Pure hypercholesterolemia, unspecified: Secondary | ICD-10-CM | POA: Diagnosis not present

## 2023-06-30 DIAGNOSIS — Z79899 Other long term (current) drug therapy: Secondary | ICD-10-CM | POA: Diagnosis not present

## 2023-06-30 DIAGNOSIS — Z0000 Encounter for general adult medical examination without abnormal findings: Secondary | ICD-10-CM | POA: Diagnosis not present

## 2023-07-16 ENCOUNTER — Other Ambulatory Visit: Payer: Self-pay | Admitting: Family Medicine

## 2023-07-16 DIAGNOSIS — R911 Solitary pulmonary nodule: Secondary | ICD-10-CM

## 2023-07-21 DIAGNOSIS — H40052 Ocular hypertension, left eye: Secondary | ICD-10-CM | POA: Diagnosis not present

## 2023-07-21 DIAGNOSIS — H401111 Primary open-angle glaucoma, right eye, mild stage: Secondary | ICD-10-CM | POA: Diagnosis not present

## 2023-07-29 ENCOUNTER — Ambulatory Visit
Admission: RE | Admit: 2023-07-29 | Discharge: 2023-07-29 | Disposition: A | Discharge status: Home / Self Care | Source: Ambulatory Visit | Attending: Family Medicine | Admitting: Family Medicine

## 2023-07-29 DIAGNOSIS — R911 Solitary pulmonary nodule: Secondary | ICD-10-CM

## 2023-07-29 DIAGNOSIS — Z85820 Personal history of malignant melanoma of skin: Secondary | ICD-10-CM | POA: Diagnosis not present

## 2023-07-29 DIAGNOSIS — R918 Other nonspecific abnormal finding of lung field: Secondary | ICD-10-CM | POA: Diagnosis not present

## 2023-08-16 DIAGNOSIS — R9389 Abnormal findings on diagnostic imaging of other specified body structures: Secondary | ICD-10-CM | POA: Diagnosis not present

## 2023-08-16 DIAGNOSIS — R0982 Postnasal drip: Secondary | ICD-10-CM | POA: Diagnosis not present

## 2023-08-18 DIAGNOSIS — K080 Exfoliation of teeth due to systemic causes: Secondary | ICD-10-CM | POA: Diagnosis not present

## 2023-08-24 DIAGNOSIS — H524 Presbyopia: Secondary | ICD-10-CM | POA: Diagnosis not present

## 2023-08-24 DIAGNOSIS — H25013 Cortical age-related cataract, bilateral: Secondary | ICD-10-CM | POA: Diagnosis not present

## 2023-08-24 DIAGNOSIS — H5213 Myopia, bilateral: Secondary | ICD-10-CM | POA: Diagnosis not present

## 2023-08-24 DIAGNOSIS — H401111 Primary open-angle glaucoma, right eye, mild stage: Secondary | ICD-10-CM | POA: Diagnosis not present

## 2023-08-24 DIAGNOSIS — H52223 Regular astigmatism, bilateral: Secondary | ICD-10-CM | POA: Diagnosis not present

## 2023-09-03 DIAGNOSIS — K080 Exfoliation of teeth due to systemic causes: Secondary | ICD-10-CM | POA: Diagnosis not present

## 2023-09-08 ENCOUNTER — Encounter: Payer: Self-pay | Admitting: Acute Care

## 2023-09-08 ENCOUNTER — Ambulatory Visit: Admitting: Acute Care

## 2023-09-08 VITALS — BP 121/76 | HR 53 | Ht 62.0 in | Wt 100.4 lb

## 2023-09-08 DIAGNOSIS — R9389 Abnormal findings on diagnostic imaging of other specified body structures: Secondary | ICD-10-CM | POA: Diagnosis not present

## 2023-09-08 DIAGNOSIS — R0600 Dyspnea, unspecified: Secondary | ICD-10-CM | POA: Diagnosis not present

## 2023-09-08 DIAGNOSIS — Z87891 Personal history of nicotine dependence: Secondary | ICD-10-CM

## 2023-09-08 DIAGNOSIS — J069 Acute upper respiratory infection, unspecified: Secondary | ICD-10-CM | POA: Diagnosis not present

## 2023-09-08 DIAGNOSIS — R911 Solitary pulmonary nodule: Secondary | ICD-10-CM

## 2023-09-08 NOTE — Progress Notes (Signed)
 History of Present Illness Abigail Stevens is a 68 y.o. female former smoker originally referred to Dr. Brenna for lung nodules in 2023, now re-referred for abnormal chest imaging suspicious for chronic indolent atypical infection such as MAI.She will be followed by Dr. Shelah.  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 Patient was referred after having coronary calcium CT scoring completing at the beginning of June 2023. Coronary calcium score was 0. However on the lung windows there was a right lower lobe pulmonary nodule few scattered nodules largest being 4 mm in size. No follow-up was considered needed if low risk however patient does have a history of melanoma patient was referred for evaluation of pulmonary nodules. Patient is a former smoker quit greater than 16 years ago. From respiratory standpoint is doing well. She is retired from Presenter, broadcasting. [place]    09/08/2023 Abigail Stevens is a 68 year old female with pulmonary nodules and a history of melanoma who presents with respiratory symptoms. She was referred by her primary care physician for evaluation of pulmonary nodules.  In 2023, pulmonary nodules were incidentally discovered during a calcium scoring scan, prompting further investigation due to her history of melanoma. Initial follow-up scans showed no change in the nodules, but a recent scan in June revealed tree-in-bud nodularity and inflammatory stranding in the right lower lobe.  She experiences a sensation of something in her chest, with increased coughing while lying down at night, particularly during a recent visit to [place]. The sputum is described as yellow, and she notes green nasal discharge, which she associates with sinus issues. No frequent coughing during the day. During the review of symptoms, she denies fever but reports yellow sputum and thick  nasal discharge.  She recently had a dental abscess related to a retained root tip from previous dental trauma. The root tip was removed last Friday, 09/03/2023, and she began taking amoxicillin 500 mg three times daily for ten days starting that evening. She questions whether the dental infection could be related to her pulmonary symptoms, and the recent findings on her CT Chest. .  She has a history of smoking, having quit in January 2007. She smoked a pack a day starting at age 10, with periods of cessation during pregnancies. She expresses concern about her respiratory health, noting her mother's history of COPD. She also mentions a family history of spherocytosis, affecting her sister and niece.  We discussed that the best way to evaluate the current Chest Ct findings is to get a sputum culture, which we cannot do until 6 weeks after she has completed her current dose of amoxicillin. Until then we will start Mucinex as a mucolytic daily and flutter valve 4-6 blows four times a day for secretion mobilization.  We  also discussed getting referral to immunology to evaluate for an immune globulin deficiency, as bronchiectasis is often a result of these deficiencies. She is in agreement with referral to allergy, immunology for evaluation.   Test Results: CT chest 07/29/2023 Interval development of multiple foci of tree-in-bud micro nodularity in the right upper lobe with the largest individual nodule measuring 0.4 cm. Additionally, areas of prior tree-in-bud micro nodularity in the anterolateral periphery of the right lower lobe and inferior aspect of the right middle lobe are slightly increased compared to prior with new ground-glass attenuation airspace opacity. Overall findings suggest an ongoing infectious/inflammatory process. Differential  considerations include multifocal bronchopneumonia, atypical pneumonia and chronic indolent atypical infection such as MAI. Recommend attention on  follow-up imaging following an appropriate course of antimicrobial therapy. 2. Otherwise, previously identified pulmonary nodule in the right lower lobe remains stable and there are no new suspicious pulmonary nodules to suggest metastatic disease.    Latest Ref Rng & Units 10/26/2018   10:13 AM 11/28/2015   12:49 PM  CBC  WBC 3.4 - 10.8 x10E3/uL 4.8  5.9   Hemoglobin 11.1 - 15.9 g/dL 87.0  85.1   Hematocrit 34.0 - 46.6 % 37.0  43.8   Platelets 150 - 400 K/uL  247        Latest Ref Rng & Units 10/26/2018   10:13 AM 11/28/2015   12:49 PM  BMP  Glucose 65 - 99 mg/dL 94  78   BUN 8 - 27 mg/dL 14  13   Creatinine 9.42 - 1.00 mg/dL 9.02  9.16   BUN/Creat Ratio 12 - 28 14    Sodium 134 - 144 mmol/L 140  139   Potassium 3.5 - 5.2 mmol/L 5.2  3.8   Chloride 96 - 106 mmol/L 101  103   CO2 20 - 29 mmol/L 25  27   Calcium 8.7 - 10.3 mg/dL 9.4  9.8     BNP No results found for: BNP  ProBNP No results found for: PROBNP  PFT No results found for: FEV1PRE, FEV1POST, FVCPRE, FVCPOST, TLC, DLCOUNC, PREFEV1FVCRT, PSTFEV1FVCRT  No results found.   Past medical hx 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   Depression    Hypothyroidism    Insomnia    Migraine    Neck pain    Shoulder pain      Social History   Tobacco Use   Smoking status: Former    Current packs/day: 1.00    Average packs/day: 1 pack/day for 24.6 years (24.6 ttl pk-yrs)    Types: Cigarettes    Start date: 2001    Quit date: 1975   Smokeless tobacco: Never   Tobacco comments:    Quit January 2007  Substance Use Topics   Alcohol use: Yes    Alcohol/week: 0.0 standard drinks of alcohol    Comment: One drink per day and 2 drinks per day on the weekends.   Drug use: No    Abigail Stevens reports that she quit smoking about 50 years ago. Her smoking use included cigarettes. She started smoking about 24 years  ago. She has a 24.6 pack-year smoking history. She has never used smokeless tobacco. She reports current alcohol use. She reports that she does not use drugs.  Tobacco Cessation: Counseling given: Not Answered Tobacco comments: Quit January 2007 Former smoker   Past surgical hx, Family hx, Social hx all reviewed.  Current Outpatient Medications on File Prior to Visit  Medication Sig   alendronate (FOSAMAX) 70 MG tablet Take 70 mg by mouth once a week. Take with a full glass of water on an empty stomach.   ALPRAZolam (XANAX) 0.25 MG tablet as needed.   escitalopram (LEXAPRO) 10 MG tablet 10 mg daily.   levothyroxine (SYNTHROID, LEVOTHROID) 75 MCG tablet Take 75 mcg by mouth See admin instructions. Taking 75mcg Mon-Fri, 37.5mg  Sat-Sun.   meloxicam (MOBIC) 7.5 MG tablet Take 7.5 mg by mouth as needed for pain.   ondansetron  (ZOFRAN -ODT) 4 MG disintegrating tablet Take  1 tablet (4 mg total) by mouth every 8 (eight) hours as needed for nausea or vomiting.   promethazine  (PHENERGAN ) 25 MG tablet Take 1 tablet (25 mg total) by mouth every 6 (six) hours as needed for nausea or vomiting. #30 per month.   rizatriptan  (MAXALT -MLT) 10 MG disintegrating tablet Take 1 tablet (10 mg total) by mouth as needed for migraine. May repeat in 2 hours if needed   SUMAtriptan  (IMITREX ) 100 MG tablet Take 1 tab at onset of migraine.  May repeat in 2 hrs, if needed.  Max dose: 2 tabs/day or 9 tabs/month. 90-day rx.   Travoprost, BAK Free, (TRAVATAN) 0.004 % SOLN ophthalmic solution Place 1 drop into both eyes at bedtime.   tretinoin (RETIN-A) 0.1 % cream Apply topically at bedtime.   SUMAtriptan  6 MG/0.5ML SOAJ Inject 6 mg into the skin as needed. (Patient not taking: Reported on 02/25/2022)   No current facility-administered medications on file prior to visit.     Allergies  Allergen Reactions   Codeine Nausea And Vomiting   White Petrolatum  Itching    Blisters skin Blisters skin     Review Of  Systems:  Constitutional:   No  weight loss, night sweats,  Fevers, chills, fatigue, or  lassitude.  HEENT:   No headaches,  Difficulty swallowing,  Tooth/dental problems, or  Sore throat,                No sneezing, itching, ear ache, +nasal congestion, +post nasal drip,   CV:  No chest pain,  Orthopnea, PND, swelling in lower extremities, anasarca, dizziness, palpitations, syncope.   GI  No heartburn, indigestion, abdominal pain, nausea, vomiting, diarrhea, change in bowel habits, loss of appetite, bloody stools.   Resp: No shortness of breath with exertion or at rest.  No excess mucus, no productive cough,  No non-productive cough,  No coughing up of blood.  No change in color of mucus.  No wheezing.  No chest wall deformity, + sensation of chest congestion  Skin: no rash or lesions.  GU: no dysuria, change in color of urine, no urgency or frequency.  No flank pain, no hematuria   MS:  No joint pain or swelling.  No decreased range of motion.  No back pain.  Psych:  No change in mood or affect. No depression or anxiety.  No memory loss.   Vital Signs BP 121/76 (BP Location: Right Arm, Patient Position: Sitting, Cuff Size: Normal)   Pulse (!) 53   Ht 5' 2 (1.575 m)   Wt 100 lb 6.4 oz (45.5 kg)   SpO2 98%   BMI 18.36 kg/m    Physical Exam:  General- No distress,  A&Ox3, pleasant ENT: No sinus tenderness, TM clear, pale nasal mucosa, no oral exudate,no post nasal drip, no LAN Cardiac: S1, S2, regular rate and rhythm, no murmur Chest: No wheeze/ rales/ dullness; no accessory muscle use, no nasal flaring, no sternal retractions Abd.: Soft Non-tender, nondistended, bowel sounds positive,Body mass index is 18.36 kg/m.  Ext: No clubbing cyanosis, edema, no obvious deformities Neuro:  normal strength, moving all extremities x 4, alert and oriented x 3, Skin: No rashes, warm and dry, no obvious skin lesions Psych: normal mood and behavior   Assessment/Plan CT shows  tree-in-bud nodularity and new inflammatory stranding in right lower lobe. Differential includes chronic atypical pneumonia or colonized bacteria. Recent dental abscess may have contributed to CT Imaging findings. - Continue amoxicillin 500 mg TID for 10 days, as ordered  by dentist. - Provide flutter valve for airway clearance, instruct use 4-6 times daily. - Recommend Mucinex 1200 mg daily with water to thin mucus. - Schedule sputum culture/ AFB and Fungal cultures six weeks post-amoxicillin. - Refer to Allergy and Immunology for immune globulin evaluation. - Order CT chest in eight weeks for reassessment.  Pulmonary nodules Former smoker  Two 4 mm nodules in right upper and lower lobes, stable in size. - Ongoing surveillance - Call for hemoptysis, unintentional weight loss  I spent 45 minutes dedicated to the care of this patient on the date of this encounter to include pre-visit review of records, face-to-face time with the patient discussing conditions above, post visit ordering of testing, clinical documentation with the electronic health record, making appropriate referrals as documented, and communicating necessary information to the patient's healthcare team.   Abigail JULIANNA Lites, NP 09/08/2023  9:46 PM

## 2023-09-08 NOTE — Patient Instructions (Addendum)
 It is good to see you today. We will start a flutter valve and mucinex 1200 mg daily with a full glass of water. Get 600 mg tabs). Use flutter valve 4-6 blows at a time, 4 times a day. Use any time your chest in congested to help mobilize secretions. I will order cultures and give you specimen cups . There will be directions on how to collect sputum attached.  You can collect sputum for culture 6 weeks after you complete your current Amoxicillin. I will refer you to allergy and Immunology to be evaluated for Immune Globulin levels. You should get a call to get this scheduled.  Follow up with me in 2 months to review the culture results and to review results CT chest in 2 months, them follow up with me. Call if you need us  sooner. Please contact office for sooner follow up if symptoms do not improve or worsen or seek emergency care

## 2023-09-14 DIAGNOSIS — K080 Exfoliation of teeth due to systemic causes: Secondary | ICD-10-CM | POA: Diagnosis not present

## 2023-09-15 DIAGNOSIS — Z85828 Personal history of other malignant neoplasm of skin: Secondary | ICD-10-CM | POA: Diagnosis not present

## 2023-09-15 DIAGNOSIS — Z85820 Personal history of malignant melanoma of skin: Secondary | ICD-10-CM | POA: Diagnosis not present

## 2023-09-15 DIAGNOSIS — L820 Inflamed seborrheic keratosis: Secondary | ICD-10-CM | POA: Diagnosis not present

## 2023-09-15 DIAGNOSIS — L218 Other seborrheic dermatitis: Secondary | ICD-10-CM | POA: Diagnosis not present

## 2023-10-12 ENCOUNTER — Ambulatory Visit: Admitting: Allergy and Immunology

## 2023-10-14 DIAGNOSIS — M25561 Pain in right knee: Secondary | ICD-10-CM | POA: Diagnosis not present

## 2023-10-14 DIAGNOSIS — M542 Cervicalgia: Secondary | ICD-10-CM | POA: Diagnosis not present

## 2023-10-19 ENCOUNTER — Other Ambulatory Visit: Payer: Self-pay

## 2023-10-19 ENCOUNTER — Encounter: Payer: Self-pay | Admitting: Allergy and Immunology

## 2023-10-19 ENCOUNTER — Ambulatory Visit (INDEPENDENT_AMBULATORY_CARE_PROVIDER_SITE_OTHER): Admitting: Allergy and Immunology

## 2023-10-19 VITALS — HR 54 | Temp 98.2°F | Resp 20 | Ht 62.5 in | Wt 102.4 lb

## 2023-10-19 DIAGNOSIS — J3089 Other allergic rhinitis: Secondary | ICD-10-CM | POA: Diagnosis not present

## 2023-10-19 DIAGNOSIS — L2089 Other atopic dermatitis: Secondary | ICD-10-CM

## 2023-10-19 DIAGNOSIS — J309 Allergic rhinitis, unspecified: Secondary | ICD-10-CM | POA: Diagnosis not present

## 2023-10-19 DIAGNOSIS — K219 Gastro-esophageal reflux disease without esophagitis: Secondary | ICD-10-CM | POA: Diagnosis not present

## 2023-10-19 DIAGNOSIS — B999 Unspecified infectious disease: Secondary | ICD-10-CM | POA: Diagnosis not present

## 2023-10-19 DIAGNOSIS — M542 Cervicalgia: Secondary | ICD-10-CM | POA: Diagnosis not present

## 2023-10-19 MED ORDER — PANTOPRAZOLE SODIUM 40 MG PO TBEC: 40.0000 mg | DELAYED_RELEASE_TABLET | Freq: Every day | ORAL | 1 refills | Status: DC

## 2023-10-19 NOTE — Progress Notes (Unsigned)
 Abigail Stevens - [place] - [place] - [place]   Dear Abigail Stevens,  Thank you for referring Abigail Stevens to the [facility] [place]  on 10/19/2023.   Below is a summation of this patient's evaluation and recommendations.  Thank you for your referral. I will keep you informed about this patient's response to treatment.   If you have any questions please do not hesitate to contact me.   [name],  [name], [name] [facility] [place]    ______________________________________________________________________    NEW PATIENT NOTE  Referring Provider: Lites Abigail FALCON, NP Primary Provider: Chrystal Lamarr RAMAN, MD Date of office visit: 10/19/2023    Subjective:   Chief Complaint:  Abigail Stevens (DOB: 1955/03/15) is a 68 y.o. female who presents to the clinic on 10/19/2023 with a chief complaint of URI (Patient states that she has nodules in her lungs. She states that she has the tree and bud from damage of a previous infection. Immune globulin of throat. Constant drainage in back of throat.) .     HPI: Abigail Stevens presents to this clinic in evaluation of infection.  Apparently Abigail Stevens has had a CT scan of her chest which identified micronodularity and she is being evaluated for infection of her airway including ruling out possible MAI or other atypical organisms.  Her infectious disease history includes an abscess that apparently occurred in her right maxilla possibly secondary to remnants of a root tip that fortunately has been addressed and resolved.  Otherwise, she does not have any infections of her skin, gastrointestinal tract, respiratory tract.  She has very little lower respiratory tract symptoms.  She can exercise without problem and have cold air cold air exposure without any problem.  She has cough.  She has a choking like cough.  She has constant drainage in her throat.  She  does not have any classic reflux symptoms.  She drinks 1 coffee per day and has chocolate about twice a week and has an alcoholic cider or martini about 4 times per week.  She has problems with eczema since childhood.  Her skin is very dry.  She uses lotions multiple times a day.  Sometimes she gets eczema inside her ear and sometimes she feels as though she gets it on her left nostril right at the orifice.  She has some rhinitis which developed over the course of the past few years which is mostly a runny nose.  She has very little sneezing.  She does not have any anosmia although her ability to smell may be diminished somewhat since she contracted COVID a few years ago.  She is having an issue with pain running up to her left eye and down into her shoulder for which she will be receiving an MRI of her neck in the near future.  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   Depression    Eczema    Hypothyroidism    Insomnia    Migraine    Neck pain    Recurrent upper respiratory infection (URI)    Shoulder pain     Past Surgical History:  Procedure Laterality Date   APPENDECTOMY     CESAREAN SECTION     COLONOSCOPY     DEBRIDEMENT AND CLOSURE WOUND Right 11/28/2015   Procedure:  COMPLEX WOUND CLOSER OF RIGHT CHEEK;  Surgeon: Alm Sick, MD;  Location: [facility] OR;  Service: Plastics;  Laterality: Right;   DILATION AND CURETTAGE OF UTERUS     MELANOMA EXCISION Right 11/28/2015   Procedure: MELANOMA EXCISION;  Surgeon: Alm Sick, MD;  Location: [facility] OR;  Service: Plastics;  Laterality: Right;   TONSILLECTOMY     TUBAL LIGATION     WISDOM TOOTH EXTRACTION      Allergies as of 10/19/2023       Reactions   Codeine Nausea And Vomiting   White Petrolatum  Itching   Blisters skin Blisters skin        Medication List    alendronate 70 MG tablet Commonly known as: FOSAMAX Take 70 mg by mouth once a week.  Take with a full glass of water on an empty stomach.   ALPRAZolam 0.25 MG tablet Commonly known as: XANAX as needed.   escitalopram 10 MG tablet Commonly known as: LEXAPRO 10 mg daily.   levothyroxine 75 MCG tablet Commonly known as: SYNTHROID Take 75 mcg by mouth See admin instructions. Taking 75mcg Mon-Fri, 37.5mg  Sat-Sun.   meloxicam 7.5 MG tablet Commonly known as: MOBIC Take 7.5 mg by mouth as needed for pain.   ondansetron  4 MG disintegrating tablet Commonly known as: ZOFRAN -ODT Take 1 tablet (4 mg total) by mouth every 8 (eight) hours as needed for nausea or vomiting.   promethazine  25 MG tablet Commonly known as: PHENERGAN  Take 1 tablet (25 mg total) by mouth every 6 (six) hours as needed for nausea or vomiting. #30 per month.   rizatriptan  10 MG disintegrating tablet Commonly known as: MAXALT -MLT Take 1 tablet (10 mg total) by mouth as needed for migraine. May repeat in 2 hours if needed   SUMAtriptan  6 MG/0.5ML Soaj Inject 6 mg into the skin as needed.   SUMAtriptan  100 MG tablet Commonly known as: IMITREX  Take 1 tab at onset of migraine.  May repeat in 2 hrs, if needed.  Max dose: 2 tabs/day or 9 tabs/month. 90-day rx.   Travoprost (BAK Free) 0.004 % Soln ophthalmic solution Commonly known as: TRAVATAN Place 1 drop into both eyes at bedtime.   tretinoin 0.1 % cream Commonly known as: RETIN-A Apply topically at bedtime.    Review of systems negative except as noted in HPI / PMHx or noted below:  Review of Systems  Constitutional: Negative.   HENT: Negative.    Eyes: Negative.   Respiratory: Negative.    Cardiovascular: Negative.   Gastrointestinal: Negative.   Genitourinary: Negative.   Musculoskeletal: Negative.   Skin: Negative.   Neurological: Negative.   Endo/Heme/Allergies: Negative.   Psychiatric/Behavioral: Negative.      Family History  Problem Relation Age of Onset   Aneurysm Mother    Dementia Mother    Heart disease Father     Kidney disease Father    Diabetes Father    Migraines Father     Social History   Socioeconomic History   Marital status: Single    Spouse name: Not on file   Number of children: 2   Years of education: 66   Highest education level: Not on file  Occupational History   Occupation: HR Consultant  Tobacco Use   Smoking status: Former    Current packs/day: 1.00    Average packs/day: 1 pack/day for 24.7 years (24.7 ttl pk-yrs)    Types: Cigarettes    Start date: 2001    Quit date: 1975   Smokeless  tobacco: Never   Tobacco comments:    Quit January 2007  Substance and Sexual Activity   Alcohol use: Yes    Alcohol/week: 0.0 standard drinks of alcohol    Comment: One drink per day and 2 drinks per day on the weekends.   Drug use: No   Sexual activity: Not on file  Other Topics Concern   Not on file  Social History Narrative   Lives at home with son and daughter.   Right-handed.   1-2 cups caffeine per day.   Environmental and Social history  Lives in a house with a dry environment, no animals looking inside the household, carpet in the bedroom, plastic on the bed, no plastic on the pillow, no smoking ongoing with inside the household.  Objective:   Vitals:   10/19/23 1006  Pulse: (!) 54  Resp: 20  Temp: 98.2 F (36.8 C)  SpO2: 99%   Height: 5' 2.5 (158.8 cm) Weight: 102 lb 6.4 oz (46.4 kg)  Physical Exam Constitutional:      Appearance: She is not diaphoretic.     Comments: Incessant throat clearing  HENT:     Head: Normocephalic.     Right Ear: Tympanic membrane, ear canal and external ear normal.     Left Ear: Tympanic membrane, ear canal and external ear normal.     Nose: Nose normal. No mucosal edema or rhinorrhea.     Mouth/Throat:     Pharynx: Uvula midline. No oropharyngeal exudate.  Eyes:     Conjunctiva/sclera: Conjunctivae normal.  Neck:     Thyroid : No thyromegaly.     Trachea: Trachea normal. No tracheal tenderness or tracheal deviation.   Cardiovascular:     Rate and Rhythm: Normal rate and regular rhythm.     Heart sounds: Normal heart sounds, S1 normal and S2 normal. No murmur heard. Pulmonary:     Effort: No respiratory distress.     Breath sounds: Normal breath sounds. No stridor. No wheezing or rales.  Lymphadenopathy:     Head:     Right side of head: No tonsillar adenopathy.     Left side of head: No tonsillar adenopathy.     Cervical: No cervical adenopathy.  Skin:    Findings: No erythema or rash.     Nails: There is no clubbing.  Neurological:     Mental Status: She is alert.     Diagnostics: Allergy skin tests were not performed.   Results of a chest CT scan obtained 29 July 2023 identified the following:  Cardiovascular: Limited evaluation in the absence of intravenous contrast. Conventional 3 vessel arch anatomy. No evidence of aneurysm. No significant atherosclerotic plaque. Heart is normal in size. No pericardial effusion.   Mediastinum/Nodes: Unremarkable CT appearance of the thyroid  gland. No suspicious mediastinal or hilar adenopathy. No soft tissue mediastinal mass. The thoracic esophagus is unremarkable.   Lungs/Pleura: Mild biapical pleuroparenchymal scarring. New foci of tree-in-bud micro nodularity along the posterolateral aspect of the right upper lobe largest individual nodule measures 0.4 cm. Stable 0.4 cm right lower lobe pulmonary nodule (image 81 series 5). Additional scattered foci of tree-in-bud micro nodularity present in the periphery of the right lower lobe anteriorly which is similar compared to prior as well as within the most inferior aspect of the right middle lobe. Findings are slightly exacerbated compared to prior and there is some new inflammatory stranding surrounding the peripheral right lower lobe tree-in-bud micro nodules suggesting reactivation of the underlying infectious/inflammatory process. No  suspicious pulmonary mass or nodule. No pleural effusion  or pneumothorax.  Assessment and Plan:    1. Recurrent infections   2. Laryngopharyngeal reflux (LPR)   3. Perennial allergic rhinitis   4. Other atopic dermatitis    1. Evaluation of immune system: blood - CBC w/d, IgA/G/M, Area two aeroallergen IgE profile, anti-Pneumo 23 serotype assay, antitetanus antibody, ANCA w/R  2. Treat and prevent LPR:   A.  Consolidate caffeine and chocolate and alcohol consumption  B.  Replaced throat clearing with swallowing/drinking maneuver  C.  Start pantoprazole  40 mg 1 tablet twice a day  3. Treat and prevent eczema:   A. Water, then ointment (Vaseline) applied while wet, when 'Good  B. Water, then hydrocortisone 2.5% ointment applied while wet, when 'flared  4. Return to clinic in 4 weeks or earlier if problem.   5. ENT evaluation??? GI evaluation???  6. Influenza = Tamiflu. Covid = Paxlovid  We will evaluate Abigail Stevens's immune system and make sure that there is not any deficit in her B-cell function and also look for atopic disease and an ANCA related inflammatory process giving rise to her CT scan abnormalities.  She appears to have LPR and I had a talk with her today about how to address that issue including behavioral modification and using a proton pump inhibitor.  She also appears to have some issues with dry skin and eczema and I have provided her topical hydration recommendations and given her a topical steroid.  If she does not do well with her throat over the course the next 4 weeks we will refer her onto ENT.  If all of her mid airway issues are tied up with reflux then this means that she has had longstanding unrecognized reflux and it would probably be worthwhile to have her undergo an endoscopy with GI to rule out Barrett's esophagus.  I will see her back in this clinic in 4 weeks.  [name], [name] [facility] [place] 

## 2023-10-19 NOTE — Patient Instructions (Addendum)
  1. Evaluation of immune system: blood - CBC w/d, IgA/G/M, Area two aeroallergen IgE profile, anti-Pneumo 23 serotype assay, antitetanus antibody, ANCA w/R  2. Treat and prevent LPR:   A.  Consolidate caffeine and chocolate and alcohol consumption  B.  Replaced throat clearing with swallowing/drinking maneuver  C.  Start pantoprazole  40 mg 1 tablet twice a day  3. Treat and prevent eczema:   A. Water, then ointment (Vaseline) applied while wet, when 'Good  B. Water, then hydrocortisone 2.5% ointment applied while wet, when 'flared  4. Return to clinic in 4 weeks or earlier if problem.   5. ENT evaluation??? GI evaluation???  6. Influenza = Tamiflu. Covid = Paxlovid

## 2023-10-20 ENCOUNTER — Encounter: Payer: Self-pay | Admitting: Allergy and Immunology

## 2023-10-20 ENCOUNTER — Other Ambulatory Visit: Payer: Self-pay

## 2023-10-20 DIAGNOSIS — K219 Gastro-esophageal reflux disease without esophagitis: Secondary | ICD-10-CM

## 2023-10-20 MED ORDER — PANTOPRAZOLE SODIUM 40 MG PO TBEC: 40.0000 mg | DELAYED_RELEASE_TABLET | Freq: Two times a day (BID) | ORAL | 2 refills | Status: DC

## 2023-10-22 LAB — CBC WITH DIFFERENTIAL/PLATELET
Basophils Absolute: 0 x10E3/uL (ref 0.0–0.2)
Basos: 0 %
EOS (ABSOLUTE): 0.1 x10E3/uL (ref 0.0–0.4)
Eos: 1 %
Hematocrit: 41.2 % (ref 34.0–46.6)
Hemoglobin: 13.6 g/dL (ref 11.1–15.9)
Immature Grans (Abs): 0 x10E3/uL (ref 0.0–0.1)
Immature Granulocytes: 0 %
Lymphocytes Absolute: 2 x10E3/uL (ref 0.7–3.1)
Lymphs: 23 %
MCH: 31.9 pg (ref 26.6–33.0)
MCHC: 33 g/dL (ref 31.5–35.7)
MCV: 97 fL (ref 79–97)
Monocytes Absolute: 0.6 x10E3/uL (ref 0.1–0.9)
Monocytes: 8 %
Neutrophils Absolute: 5.7 x10E3/uL (ref 1.4–7.0)
Neutrophils: 68 %
Platelets: 262 x10E3/uL (ref 150–450)
RBC: 4.27 x10E6/uL (ref 3.77–5.28)
RDW: 12 % (ref 11.7–15.4)
WBC: 8.5 x10E3/uL (ref 3.4–10.8)

## 2023-10-22 LAB — ALLERGENS W/COMP RFLX AREA 2
Alternaria Alternata IgE: <0.1 kU/L
Aspergillus Fumigatus IgE: <0.1 kU/L
Bermuda Grass IgE: <0.1 kU/L
Cedar, Mountain IgE: <0.1 kU/L
Cladosporium Herbarum IgE: <0.1 kU/L
Cockroach, German IgE: <0.1 kU/L
Common Silver Birch IgE: <0.1 kU/L
Cottonwood IgE: <0.1 kU/L
D Farinae IgE: <0.1 kU/L
D Pteronyssinus IgE: <0.1 kU/L
E001-IgE Cat Dander: <0.1 kU/L
E005-IgE Dog Dander: <0.1 kU/L
Elm, American IgE: <0.1 kU/L
IgE (Immunoglobulin E), Serum: 9 [IU]/mL (ref 6–495)
Johnson Grass IgE: <0.1 kU/L
Maple/Box Elder IgE: <0.1 kU/L
Mouse Urine IgE: <0.1 kU/L
Oak, White IgE: <0.1 kU/L
Pecan, Hickory IgE: <0.1 kU/L
Penicillium Chrysogen IgE: <0.1 kU/L
Pigweed, Rough IgE: <0.1 kU/L
Ragweed, Short IgE: <0.1 kU/L
Sheep Sorrel IgE Qn: <0.1 kU/L
Timothy Grass IgE: <0.1 kU/L
White Mulberry IgE: <0.1 kU/L

## 2023-10-22 LAB — STREP PNEUMONIAE 23 SEROTYPES IGG
Pneumo Ab Type 1*: 2.2 ug/mL (ref >1.3)
Pneumo Ab Type 12 (12F)*: 0.4 ug/mL — AB (ref >1.3)
Pneumo Ab Type 14*: 2.6 ug/mL (ref >1.3)
Pneumo Ab Type 17 (17F)*: 1 ug/mL — AB (ref >1.3)
Pneumo Ab Type 19 (19F)*: 1.1 ug/mL — AB (ref >1.3)
Pneumo Ab Type 2*: <0.2 ug/mL — AB (ref >1.3)
Pneumo Ab Type 20*: 1.9 ug/mL (ref >1.3)
Pneumo Ab Type 22 (22F)*: 0.6 ug/mL — AB (ref >1.3)
Pneumo Ab Type 23 (23F)*: 0.9 ug/mL — AB (ref >1.3)
Pneumo Ab Type 26 (6B)*: 0.2 ug/mL — AB (ref >1.3)
Pneumo Ab Type 3*: 0.1 ug/mL — AB (ref >1.3)
Pneumo Ab Type 34 (10A)*: 0.1 ug/mL — AB (ref >1.3)
Pneumo Ab Type 4*: 0.5 ug/mL — AB (ref >1.3)
Pneumo Ab Type 43 (11A)*: 0.3 ug/mL — AB (ref >1.3)
Pneumo Ab Type 5*: 1.4 ug/mL (ref >1.3)
Pneumo Ab Type 51 (7F)*: 0.3 ug/mL — AB (ref >1.3)
Pneumo Ab Type 54 (15B)*: 2.6 ug/mL (ref >1.3)
Pneumo Ab Type 56 (18C)*: 1.4 ug/mL (ref >1.3)
Pneumo Ab Type 57 (19A)*: 1.5 ug/mL (ref >1.3)
Pneumo Ab Type 68 (9V)*: 0.4 ug/mL — AB (ref >1.3)
Pneumo Ab Type 70 (33F)*: 1.2 ug/mL — AB (ref >1.3)
Pneumo Ab Type 8*: 1.3 ug/mL — AB (ref >1.3)
Pneumo Ab Type 9 (9N)*: 0.3 ug/mL — AB (ref >1.3)

## 2023-10-22 LAB — DIPHTHERIA / TETANUS ANTIBODY PANEL
Diphtheria Ab: 0.27 [IU]/mL (ref <0.10)
Tetanus Ab, IgG: 1.85 [IU]/mL (ref <0.10)

## 2023-10-22 LAB — ANCA TITERS
Atypical pANCA: <1:20 {titer}
C-ANCA: <1:20 {titer}
P-ANCA: <1:20 {titer}

## 2023-10-22 LAB — IGG, IGA, IGM
IgA/Immunoglobulin A, Serum: 108 mg/dL (ref 87–352)
IgG (Immunoglobin G), Serum: 796 mg/dL (ref 586–1602)
IgM (Immunoglobulin M), Srm: 54 mg/dL (ref 26–217)

## 2023-10-24 ENCOUNTER — Ambulatory Visit: Payer: Self-pay | Admitting: Allergy & Immunology

## 2023-11-01 ENCOUNTER — Ambulatory Visit (HOSPITAL_BASED_OUTPATIENT_CLINIC_OR_DEPARTMENT_OTHER)
Admission: RE | Admit: 2023-11-01 | Discharge: 2023-11-01 | Disposition: A | Discharge status: Home / Self Care | Source: Ambulatory Visit | Attending: Acute Care | Admitting: Acute Care

## 2023-11-01 DIAGNOSIS — R918 Other nonspecific abnormal finding of lung field: Secondary | ICD-10-CM | POA: Insufficient documentation

## 2023-11-01 DIAGNOSIS — J069 Acute upper respiratory infection, unspecified: Secondary | ICD-10-CM | POA: Diagnosis not present

## 2023-11-02 DIAGNOSIS — M542 Cervicalgia: Secondary | ICD-10-CM | POA: Diagnosis not present

## 2023-11-09 ENCOUNTER — Telehealth: Payer: Self-pay

## 2023-11-09 ENCOUNTER — Encounter: Payer: Self-pay | Admitting: Acute Care

## 2023-11-09 ENCOUNTER — Ambulatory Visit: Admitting: Acute Care

## 2023-11-09 VITALS — BP 147/86 | HR 49 | Temp 97.8°F | Ht 62.0 in | Wt 104.2 lb

## 2023-11-09 DIAGNOSIS — R911 Solitary pulmonary nodule: Secondary | ICD-10-CM | POA: Diagnosis not present

## 2023-11-09 DIAGNOSIS — R9389 Abnormal findings on diagnostic imaging of other specified body structures: Secondary | ICD-10-CM | POA: Diagnosis not present

## 2023-11-09 DIAGNOSIS — Z87891 Personal history of nicotine dependence: Secondary | ICD-10-CM

## 2023-11-09 DIAGNOSIS — J479 Bronchiectasis, uncomplicated: Secondary | ICD-10-CM

## 2023-11-09 MED ORDER — SODIUM CHLORIDE 3 % IN NEBU: INHALATION_SOLUTION | RESPIRATORY_TRACT | 2 refills | Status: DC

## 2023-11-09 NOTE — Progress Notes (Signed)
 History of Present Illness Abigail Stevens is a 68 y.o. female former smoker originally referred to Dr. Brenna for lung nodules in 2023, now re-referred for abnormal chest imaging suspicious for chronic indolent atypical infection such as MAI.She will be followed by Dr. Shelah.   11/09/2023 Discussed the use of AI scribe software for clinical note transcription with the patient, who gave verbal consent to proceed.  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 Patient was referred after having coronary calcium CT scoring completing at the beginning of June 2023. Coronary calcium score was 0. However on the lung windows there was a right lower lobe pulmonary nodule few scattered nodules largest being 4 mm in size. No follow-up was considered needed if low risk however patient does have a history of melanoma patient was referred for evaluation of pulmonary nodules. Patient is a former smoker quit greater than 16 years ago. From respiratory standpoint is doing well. She is retired from [employer]. [place] She was seen 08/2023. Plan was for follow up Ct Chest in 3 months, referral to immunology, and sputum cultures for assessment. She is here to follow up after follow up CT Chest. She has been unable to produce sputum.   History of Present Illness Abigail Stevens is a 67 year old female with chronic pulmonary issues who presents with persistent respiratory symptoms and concerns about immune response.  She experiences persistent respiratory symptoms, including a sensation of constant drainage down her throat into her chest, occurring continuously. She has a history of chronic respiratory symptoms and imaging has shown persistent nodularity in the right middle and lower lobes. A recent scan showed some improvement in the 'tree and bud' pattern in the right upper lobe, but she still experiences  persistent nodularity in the right middle and lower lobes. We had hoped she would be able to produce a sputum specimen, however she has been unable to do so.   She has a history of receiving the pneumonia vaccine two years ago, but recent tests showed she lacks antibodies for pneumonia, which concerns her as winter approaches. Her immunoglobulin levels (IgG, IgA, IgM) are normal, but her strep pneumonia antibody levels are mostly low, with only seven out of twenty-five being normal.She has follow up with Dr. Kozlo to review her labs, and determine next best steps in plan of care.  She has difficulty with coughing up sputum despite using a flutter valve and Mucinex. She has had a chronic cough x 6 months, which has not improved with Mucinex and Flutter valve.There is a sensation of something running down her throat and into her chest, which she attributes to possible laryngopharyngeal reflux (LPR). She is currently taking Protonix  (pantoprazole ) twice daily but has not noticed significant improvement.I wondered if she has been aspirating, as she endorses having trouble swallowing her food. She states she can get choked up on it. I offered a swallow evaluation , but she prefers to go and see the GI consult first, before having her swallow assessed. We will check on this again when she follows up. I also offered referral to ID, but she will wait on that to see if she can expectorate some sputum for culture. I have stated Hypertonic saline nebs x 2 weeks to see if patient can cough up secretions.   She has a history of thyroid  disease with calcification around her thyroid  and neck, which  she speculates might contribute to her swallowing difficulties. She gets choked easily when eating or drinking, even with water, and sometimes experiences green nasal discharge, suggesting an infection.  She is a former smoker and has significant exposure to birds [place], which she worries might be related to her  lung issues. She is concerned about the chronic nature of her symptoms and the lack of progress in her condition.     Test Results: CT Chest 11/01/2023 1.Improved tree-in-bud nodularity in the apical posterior right upper lobe with residual 5 mm nodule. Per Fleischner Society Guidelines if patient is low risk for malignancy, no routine follow-up imaging is recommended. If patient is high risk for malignancy, a non-contrast chest CT at 12 months is optional. If performed and the nodule is stable at 12 months, no further follow-up is recommended. 2. Stable 5 mm pulmonary nodule in the right lower lobe. 3. Persistent nodularity in the anterior segment of the right lower lobe and basilar right middle lobe, consistent with a chronic inflammatory process such as atypical mycobacterial infection.   CT chest 07/29/2023 Interval development of multiple foci of tree-in-bud micro nodularity in the right upper lobe with the largest individual nodule measuring 0.4 cm. Additionally, areas of prior tree-in-bud micro nodularity in the anterolateral periphery of the right lower lobe and inferior aspect of the right middle lobe are slightly increased compared to prior with new ground-glass attenuation airspace opacity. Overall findings suggest an ongoing infectious/inflammatory process. Differential considerations include multifocal bronchopneumonia, atypical pneumonia and chronic indolent atypical infection such as MAI. Recommend attention on follow-up imaging following an appropriate course of antimicrobial therapy. 2. Otherwise, previously identified pulmonary nodule in the right lower lobe remains stable and there are no new suspicious pulmonary nodules to suggest metastatic disease.    Latest Ref Rng & Units 10/19/2023   11:02 AM 10/26/2018   10:13 AM 11/28/2015   12:49 PM  CBC  WBC 3.4 - 10.8 x10E3/uL 8.5  4.8  5.9   Hemoglobin 11.1 - 15.9 g/dL 86.3  87.0  85.1   Hematocrit 34.0 - 46.6 % 41.2   37.0  43.8   Platelets 150 - 450 x10E3/uL 262   247        Latest Ref Rng & Units 10/26/2018   10:13 AM 11/28/2015   12:49 PM  BMP  Glucose 65 - 99 mg/dL 94  78   BUN 8 - 27 mg/dL 14  13   Creatinine 9.42 - 1.00 mg/dL 9.02  9.16   BUN/Creat Ratio 12 - 28 14    Sodium 134 - 144 mmol/L 140  139   Potassium 3.5 - 5.2 mmol/L 5.2  3.8   Chloride 96 - 106 mmol/L 101  103   CO2 20 - 29 mmol/L 25  27   Calcium 8.7 - 10.3 mg/dL 9.4  9.8     BNP No results found for: BNP  ProBNP No results found for: PROBNP  PFT No results found for: FEV1PRE, FEV1POST, FVCPRE, FVCPOST, TLC, DLCOUNC, PREFEV1FVCRT, PSTFEV1FVCRT  CT CHEST WO CONTRAST Result Date: 11/05/2023 EXAM: CT CHEST WITHOUT CONTRAST 11/01/2023 11:18:00 AM TECHNIQUE: CT of the chest was performed without the administration of intravenous contrast. Multiplanar reformatted images are provided for review. Automated exposure control, iterative reconstruction, and/or weight based adjustment of the mA/kV was utilized to reduce the radiation dose to as low as reasonably achievable. COMPARISON: Jul 29 2023. CLINICAL HISTORY: Pneumonia suspected, uncomplicated, no prior imaging (Ped >= 48mo). Pt in w/  sx of PNA/URI. HX lung nodules FINDINGS: MEDIASTINUM: The central airways are widely patent. LYMPH NODES: No mediastinal, hilar or axillary lymphadenopathy is mentioned. LUNGS AND PLEURA: Previously noted tree-in-bud nodularity within the upper apical posterior right upper lobe has improved with residual 5 mm nodule seen in this region. A stable 5 mm pulmonary nodule is seen in the right lower lobe. Persistent nodularity is seen within the anterior segment of the right lower lobe, in keeping with a chronic inflammatory process, such as atypical mycobacterial infection. Similar changes are seen within the basilar right middle lobe. No new focal pulmonary nodules or infiltrates are identified. No pleural effusion. No pneumothorax. SOFT  TISSUES/BONES: No acute abnormality of the bones or soft tissues is mentioned. UPPER ABDOMEN: Limited images of the upper abdomen demonstrates no acute abnormality. IMPRESSION: 1. Improved tree-in-bud nodularity in the apical posterior right upper lobe with residual 5 mm nodule. Per Fleischner Society Guidelines if patient is low risk for malignancy, no routine follow-up imaging is recommended. If patient is high risk for malignancy, a non-contrast chest CT at 12 months is optional. If performed and the nodule is stable at 12 months, no further follow-up is recommended. 2. Stable 5 mm pulmonary nodule in the right lower lobe. 3. Persistent nodularity in the anterior segment of the right lower lobe and basilar right middle lobe, consistent with a chronic inflammatory process such as atypical mycobacterial infection. 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   Drug use: No    Abigail Stevens reports that she quit smoking about 50 years ago. Her smoking use included cigarettes. She started smoking about 24 years ago. She has a 24.7 pack-year smoking history. She has never used smokeless tobacco. She reports current alcohol use.  She reports that she does not use drugs.  Tobacco Cessation: Counseling given: Not Answered Tobacco comments: Quit January 2007 Former smoker, quit 2007 with a 24.7 pack year smoking history  Past surgical hx, Family hx, Social hx all reviewed.  Current Outpatient Medications on File Prior to Visit  Medication Sig   alendronate (FOSAMAX) 70 MG tablet Take 70 mg by mouth once a week. Take with a full glass of water on an empty stomach.   ALPRAZolam (XANAX) 0.25 MG tablet as needed.   escitalopram (LEXAPRO) 10 MG tablet 10 mg daily.   levothyroxine (SYNTHROID, LEVOTHROID) 75 MCG tablet Take 75 mcg by mouth See admin instructions. Taking 75mcg Mon-Fri, 37.5mg  Sat-Sun.   meloxicam (MOBIC) 7.5  MG tablet Take 7.5 mg by mouth as needed for pain.   ondansetron  (ZOFRAN -ODT) 4 MG disintegrating tablet Take 1 tablet (4 mg total) by mouth every 8 (eight) hours as needed for nausea or vomiting.   pantoprazole  (PROTONIX ) 40 MG tablet Take 1 tablet (40 mg total) by mouth 2 (two) times daily.   promethazine  (PHENERGAN ) 25 MG tablet Take 1 tablet (25 mg total) by mouth every 6 (six) hours as needed for nausea or vomiting. #30 per month.   rizatriptan  (MAXALT -MLT) 10 MG disintegrating tablet Take 1 tablet (10 mg total) by mouth as needed for migraine. May repeat in 2 hours if needed   SUMAtriptan  (IMITREX ) 100 MG tablet Take 1 tab at onset of migraine.  May repeat in 2 hrs, if needed.  Max dose: 2 tabs/day or 9 tabs/month. 90-day rx.   Travoprost, BAK Free, (TRAVATAN) 0.004 % SOLN ophthalmic solution Place 1 drop into both eyes at bedtime.   tretinoin (RETIN-A) 0.1 % cream Apply topically at bedtime.   No current facility-administered medications on file prior to visit.     Allergies  Allergen Reactions   Codeine Nausea And Vomiting   White Petrolatum  Itching    Blisters skin Blisters skin     Review Of Systems:  Constitutional:   No  weight loss, night sweats,  Fevers, chills, fatigue, or   lassitude.  HEENT:   No headaches,  +Difficulty swallowing,  Tooth/dental problems, or  Sore throat,                No sneezing, itching, ear ache, +nasal congestion, +post nasal drip,   CV:  No chest pain,  Orthopnea, PND, swelling in lower extremities, anasarca, dizziness, palpitations, syncope.   GI  No heartburn, indigestion, abdominal pain, nausea, vomiting, diarrhea, change in bowel habits, loss of appetite, bloody stools.   Resp: No shortness of breath with exertion or at rest.  + excess mucus, No productive cough,  + non-productive cough,  No coughing up of blood.  No change in color of mucus.  occasional wheezing.  No chest wall deformity  Skin: no rash or lesions.  GU: no dysuria, change in color of urine, no urgency or frequency.  No flank pain, no hematuria   MS:  No joint pain or swelling.  No decreased range of motion.  No back pain.  Psych:  No change in mood or affect. No depression or anxiety.  No memory loss.   Vital Signs BP (!) 147/86   Pulse (!) 49   Temp 97.8 F (36.6 C) (Oral)   Ht 5' 2 (1.575 m)   Wt 104 lb 3.2 oz (47.3 kg)   SpO2 97%   BMI 19.06 kg/m    Physical Exam:  General- No distress,  A&Ox3, pleasant and appropriate ENT: No sinus tenderness, TM clear, pale nasal mucosa, no oral exudate,no post nasal drip, no LAN Cardiac: S1, S2, regular rate and rhythm, no murmur Chest: No wheeze/ rales/ dullness; no accessory muscle use, no nasal flaring, no sternal retractions Abd.: Soft Non-tender, ND, BS +, Body mass index is 19.06 kg/m.  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  Persistent nodularity on CT.  Unable to obtain sputum Cx  Discussed nebulized saline for sputum production.  Considered infectious disease referral for MAC treatment. - Order nebulized 3% saline twice daily for two weeks. - Provide sputum collection cups. - Consider infectious disease referral if sputum cultures confirm MAC. - Evaluate for aspiration as a contributing factor.>> swallow exam offered, patient deferred until after she sees GI -Continue flutter valve to help mobilize secretions   Dysphagia Swallowing difficulty and frequent choking, potential aspiration risk.  Previous thyroid  calcification.  LPR suspected, treated with pantoprazole  without significant improvement. - Refer to speech therapy for swallow evaluation. - Continue pantoprazole  for LPR.  Right upper lobe pulmonary nodule 5 mm nodule in right upper lobe. No routine follow-up if low malignancy risk. Improvement in tree-in-bud pattern on imaging. - Order follow-up CT scan in 12 months.  Impaired pneumococcal antibody response after vaccination Impaired response to pneumococcal vaccination.  Normal immunoglobulin levels suggest specific antibody deficiency. - Discuss with immunologist regarding potential Pneumovax booster.  AVS 11/09/2023 Your CT Chest looks better, but there is still that area  in the right middle lobe and right lower lobe with persistant nodularity.  We will start saline nebs, Use in the morning and the evening x 2 weeks.  I have ordered a neb machine through  a DME. You should get a call to either pick this up or they will get it to you. If you can cough anything up, please use the cups we gave you to collect the specimen and get it to us  to culture.  Follow up with GI. If you need a swallow eval we can discuss at follow up.  Follow up with me in 2 weeks so we can see if you have been able to cough anything up.  If you can, please bring in  within 4 so we can culture.  Please contact office for sooner follow up if symptoms do not improve or worsen or seek emergency care     I spent 40 minutes dedicated to the care of this patient on the date of this encounter to include pre-visit review of records, face-to-face time with the patient discussing conditions above, post visit ordering of testing, clinical documentation with the electronic health record, making appropriate referrals as documented, and communicating necessary information to the patient's healthcare team.     Abigail JULIANNA Lites, NP 11/09/2023  10:01 AM

## 2023-11-09 NOTE — Patient Instructions (Addendum)
 It is good to see you today. Your CT Chest looks better, but there is still that area  in the right middle lobe and right lower lobe with persistant nodularity.  We will start saline nebs, Use in the morning and the evening x 2 weeks.  I have ordered a neb machine through  a DME. You should get a call to either pick this up or they will get it to you. If you can cough anything up, please use the cups we gave you to collect the specimen and get it to us  to culture.  Follow up with GI. If you need a swallow eval we can discuss at follow up.  Follow up with me in 2 weeks so we can see if you have been able to cough anything up.  If you can, please bring in within 4 so we can culture.  Please contact office for sooner follow up if symptoms do not improve or worsen or seek emergency care

## 2023-11-09 NOTE — Telephone Encounter (Signed)
 Called [facility] radiology reading room to request a overread on patient last two scans to see if they qualify for bronchiectasis

## 2023-11-10 DIAGNOSIS — J479 Bronchiectasis, uncomplicated: Secondary | ICD-10-CM

## 2023-11-11 ENCOUNTER — Encounter: Payer: Self-pay | Admitting: Allergy and Immunology

## 2023-11-12 ENCOUNTER — Telehealth: Payer: Self-pay | Admitting: Acute Care

## 2023-11-12 NOTE — Telephone Encounter (Signed)
 Would you mind signing this DME order? Thank you.

## 2023-11-15 ENCOUNTER — Telehealth: Payer: Self-pay | Admitting: Acute Care

## 2023-11-15 DIAGNOSIS — M5412 Radiculopathy, cervical region: Secondary | ICD-10-CM | POA: Diagnosis not present

## 2023-11-15 DIAGNOSIS — M5030 Other cervical disc degeneration, unspecified cervical region: Secondary | ICD-10-CM | POA: Diagnosis not present

## 2023-11-15 NOTE — Telephone Encounter (Signed)
 Order signed and sent to DME

## 2023-11-15 NOTE — Telephone Encounter (Signed)
 Could you please sign this order? Thank you

## 2023-11-15 NOTE — Telephone Encounter (Signed)
Please disregard.    Thank you.

## 2023-11-16 ENCOUNTER — Ambulatory Visit: Admitting: Allergy and Immunology

## 2023-11-16 VITALS — BP 102/86 | HR 83 | Temp 96.0°F | Ht 62.0 in | Wt 102.0 lb

## 2023-11-16 DIAGNOSIS — J324 Chronic pansinusitis: Secondary | ICD-10-CM | POA: Diagnosis not present

## 2023-11-16 DIAGNOSIS — K219 Gastro-esophageal reflux disease without esophagitis: Secondary | ICD-10-CM

## 2023-11-16 DIAGNOSIS — B999 Unspecified infectious disease: Secondary | ICD-10-CM

## 2023-11-16 DIAGNOSIS — J479 Bronchiectasis, uncomplicated: Secondary | ICD-10-CM | POA: Diagnosis not present

## 2023-11-16 MED ORDER — PANTOPRAZOLE SODIUM 40 MG PO TBEC: 40.0000 mg | DELAYED_RELEASE_TABLET | Freq: Two times a day (BID) | ORAL | 1 refills | Status: AC

## 2023-11-16 MED ORDER — TRIAMCINOLONE ACETONIDE 55 MCG/ACT NA AERO: 2.0000 | INHALATION_SPRAY | Freq: Every morning | NASAL | 1 refills | Status: AC

## 2023-11-16 MED ORDER — HYDROCORTISONE 2.5 % EX CREA: TOPICAL_CREAM | Freq: Every evening | CUTANEOUS | 1 refills | Status: AC

## 2023-11-16 NOTE — Progress Notes (Unsigned)
 [place] [facility] [place] - [place] - [place] - [place]   Follow-up Note  Referring Provider: Chrystal Lamarr RAMAN, * Primary Provider: Chrystal Lamarr RAMAN, MD Date of Office Visit: 11/16/2023  Subjective:   Abigail Stevens (DOB: [birth year]) is a 68 y.o. female who [facility] in re-evaluation of the following:  HPI: Abigail Stevens returns to this clinic in evaluation of recurrent infections, LPR, allergic rhinitis, atopic dermatitis.  I last saw her in this clinic during her initial evaluation 19 October 2023.  During her last visit we treated her for LPR and she is definitely better regarding her throat clearing yet she still has some sensation that she has lots of drainage coming down from her head into her throat.  She has not had any coughing or shortness of breath and she can exert herself without any problem.  But she is extremely worried about the CT scan documented abnormality in her airway and she has been reading on Google about some of the possibilities giving rise to this abnormality in which she would like some more answers about what is causing this and how to treat this.  When she did visit with pulmonology recently they recommended that she started a saline nebulization plan to help loosen up the mucus in her chest.  She was unable to ever provide any sputum for culture and for screening of mycobacterial infection.  She has not been having any issues with eczema.  Allergies as of 11/16/2023       Reactions   Codeine Nausea And Vomiting   White Petrolatum  Itching   Blisters skin Blisters skin        Medication List    alendronate 70 MG tablet Commonly known as: FOSAMAX Take 70 mg by mouth once a week. Take with a full glass of water on an empty stomach.   ALPRAZolam 0.25 MG tablet Commonly known as: XANAX as needed.   escitalopram 10 MG tablet Commonly known as: LEXAPRO 10 mg daily.   levothyroxine 75 MCG  tablet Commonly known as: SYNTHROID Take 75 mcg by mouth See admin instructions. Taking 75mcg Mon-Fri, 37.5mg  Sat-Sun.   meloxicam 7.5 MG tablet Commonly known as: MOBIC Take 7.5 mg by mouth as needed for pain.   ondansetron  4 MG disintegrating tablet Commonly known as: ZOFRAN -ODT Take 1 tablet (4 mg total) by mouth every 8 (eight) hours as needed for nausea or vomiting.   pantoprazole  40 MG tablet Commonly known as: Protonix  Take 1 tablet (40 mg total) by mouth 2 (two) times daily.   promethazine  25 MG tablet Commonly known as: PHENERGAN  Take 1 tablet (25 mg total) by mouth every 6 (six) hours as needed for nausea or vomiting. #30 per month.   rizatriptan  10 MG disintegrating tablet Commonly known as: MAXALT -MLT Take 1 tablet (10 mg total) by mouth as needed for migraine. May repeat in 2 hours if needed   sodium chloride HYPERTONIC 3 % nebulizer solution Use twice daily x 2 weeks to see if this can help get secretions up.   SUMAtriptan  100 MG tablet Commonly known as: IMITREX  Take 1 tab at onset of migraine.  May repeat in 2 hrs, if needed.  Max dose: 2 tabs/day or 9 tabs/month. 90-day rx.   timolol 0.5 % ophthalmic solution Commonly known as: TIMOPTIC Place into the right eye.   Travoprost (BAK Free) 0.004 % Soln ophthalmic solution Commonly known as: TRAVATAN Place 1 drop into both eyes at bedtime.  tretinoin 0.1 % cream Commonly known as: RETIN-A Apply topically at bedtime.    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   Depression    Eczema    Hypothyroidism    Insomnia    Migraine    Neck pain    Recurrent upper respiratory infection (URI)    Shoulder pain     Past Surgical History:  Procedure Laterality Date   APPENDECTOMY     CESAREAN SECTION     COLONOSCOPY     DEBRIDEMENT AND CLOSURE WOUND Right 11/28/2015   Procedure: COMPLEX WOUND CLOSER OF RIGHT CHEEK;   Surgeon: Alm Sick, MD;  Location: [facility] OR;  Service: Plastics;  Laterality: Right;   DILATION AND CURETTAGE OF UTERUS     MELANOMA EXCISION Right 11/28/2015   Procedure: MELANOMA EXCISION;  Surgeon: Alm Sick, MD;  Location: [facility] OR;  Service: Plastics;  Laterality: Right;   TONSILLECTOMY     TUBAL LIGATION     WISDOM TOOTH EXTRACTION      Review of systems negative except as noted in HPI / PMHx or noted below:  Review of Systems  Constitutional: Negative.   HENT: Negative.    Eyes: Negative.   Respiratory: Negative.    Cardiovascular: Negative.   Gastrointestinal: Negative.   Genitourinary: Negative.   Musculoskeletal: Negative.   Skin: Negative.   Neurological: Negative.   Endo/Heme/Allergies: Negative.   Psychiatric/Behavioral: Negative.       Objective:   Vitals:   11/16/23 1351  BP: 102/86  Pulse: 83  Temp: (!) 96 F (35.6 C)  SpO2: 96%   Height: 5' 2 (157.5 cm)  Weight: 102 lb (46.3 kg)   Physical Exam Constitutional:      Appearance: She is not diaphoretic.  HENT:     Head: Normocephalic.     Right Ear: Tympanic membrane, ear canal and external ear normal.     Left Ear: Tympanic membrane, ear canal and external ear normal.     Nose: Nose normal. No mucosal edema or rhinorrhea.     Mouth/Throat:     Pharynx: Uvula midline. No oropharyngeal exudate.  Eyes:     Conjunctiva/sclera: Conjunctivae normal.  Neck:     Thyroid : No thyromegaly.     Trachea: Trachea normal. No tracheal tenderness or tracheal deviation.  Cardiovascular:     Rate and Rhythm: Normal rate and regular rhythm.     Heart sounds: Normal heart sounds, S1 normal and S2 normal. No murmur heard. Pulmonary:     Effort: No respiratory distress.     Breath sounds: Normal breath sounds. No stridor. No wheezing or rales.  Lymphadenopathy:     Head:     Right side of head: No tonsillar adenopathy.     Left side of head: No tonsillar adenopathy.     Cervical: No cervical adenopathy.   Skin:    Findings: No erythema or rash.     Nails: There is no clubbing.  Neurological:     Mental Status: She is alert.     Diagnostics:   Results of blood tests obtained 19 October 2023 identifies IgG 796 MGs/DL, IgA 891 mg/DL, IgM 54 mg/DL, IgE 9 KU/L, no antigen specific IgE antibodies identified on an area to aeroallergen IgE profile, WBC 8.5, absolute eosinophil 100, absolute lymphocyte 2000, hemoglobin 13.6, platelet 262, only 7 out of 23 pneumococcal serotypes with adequate antibody  levels, tetanus antibody IgG 1.8 5U/mL  Results of a chest CT scan obtained 01 November 2023 identified the following:  1. Improved tree-in-bud nodularity in the right upper lobe with residual 5 mm nodule, consistent with resolving infectious or inflammatory process. 2. Persistent nodularity in the anterior segment of the right lower lobe and basilar right middle lobe, consistent with a chronic inflammatory process such as atypical mycobacterial infection. 3. Stable 5 mm pulmonary nodule in the right lower lobe. 4. Subtle bronchiectasis involving the third and fourth order bronchioles diffusely.  Assessment and Plan:   1. Chronic pansinusitis   2. Recurrent infections   3. Laryngopharyngeal reflux (LPR)     1. Evaluation for chronic sinusitis:   A. Obtain sinus CT scan  2. Evaluation of immune system responsiveness:   A. pneumococcal vaccine (?) then repeat Pneumo23 4 weeks later  3. Treat inflammation and mucous production of airway:  A. Start OTC Nasacort  - 2 sprays each nostril 1 time per day B. Start saline nebulization at least 2 times per day   4. Continue to treat and prevent LPR:   A.  Consolidate caffeine and chocolate and alcohol consumption  B.  Replace throat clearing with swallowing/drinking maneuver  C.  Pantoprazole  40 mg 1 tablet twice a day  3. Treat and prevent eczema:   A. Water, then ointment (Vaseline) applied while wet, when 'Good  B. Water, then  hydrocortisone 2.5% ointment applied while wet, when 'flared  4. Further evaluation and treatment???  5. Influenza = Tamiflu. Covid = Paxlovid  I think we need to do a little bit more investigation regarding Kathy's issues.  Given the fact that she has what appears to be a persistent infection of her lower airway or some persistent inflammatory reaction we will screen her upper airway for chronic sinusitis with a sinus CT scan.  She does not really have very robust immunological response against the Prevnar that she received 2 years ago and we will get her pneumococcal vaccine and then repeat the Pneumo 23 serotype assay 4 weeks after that vaccination to make sure that her immune system can respond to immune agents.  I am going to start her on some nasal steroid to be utilized on a consistent basis and she can use the saline nebulization.  She can continue on her therapy for LPR and for her eczema.   Camellia Denis, [name] [facility]

## 2023-11-16 NOTE — Patient Instructions (Signed)
  1. Evaluation for chronic sinusitis:   A. Obtain sinus CT scan  2. Evaluation of immune system responsiveness:   A. pneumococcal vaccine (?) then repeat Pneumo23 4 weeks later  3. Treat inflammation and mucous production of airway:  A. Start OTC Nasacort  - 2 sprays each nostril 1 time per day B. Start saline nebulization at least 2 times per day   4. Continue to treat and prevent LPR:   A.  Consolidate caffeine and chocolate and alcohol consumption  B.  Replace throat clearing with swallowing/drinking maneuver  C.  Pantoprazole  40 mg 1 tablet twice a day  3. Treat and prevent eczema:   A. Water, then ointment (Vaseline) applied while wet, when 'Good  B. Water, then hydrocortisone 2.5% ointment applied while wet, when 'flared  4. Further evaluation and treatment???  5. Influenza = Tamiflu. Covid = Paxlovid

## 2023-11-17 ENCOUNTER — Encounter: Payer: Self-pay | Admitting: Allergy and Immunology

## 2023-11-17 ENCOUNTER — Telehealth: Payer: Self-pay

## 2023-11-17 ENCOUNTER — Other Ambulatory Visit: Payer: Self-pay

## 2023-11-17 DIAGNOSIS — B999 Unspecified infectious disease: Secondary | ICD-10-CM

## 2023-11-17 NOTE — Telephone Encounter (Signed)
 PT called back - scheduled pnemuovax vaccine and advised to come in for blood work 4 weeks after that - PT thanked

## 2023-11-17 NOTE — Telephone Encounter (Signed)
 Called patient and LVM to schedule pneumovax 23 vaccine at [facility] office or that she can go to her PCP if needing an earlier date then 10/14. Please schedule her for above vaccination if she returns phone call.   Labs have been printed an given to rachelle for future lab draw.

## 2023-11-18 DIAGNOSIS — J479 Bronchiectasis, uncomplicated: Secondary | ICD-10-CM | POA: Diagnosis not present

## 2023-11-26 ENCOUNTER — Ambulatory Visit: Admitting: Acute Care

## 2023-11-29 ENCOUNTER — Ambulatory Visit

## 2023-11-30 DIAGNOSIS — H40052 Ocular hypertension, left eye: Secondary | ICD-10-CM | POA: Diagnosis not present

## 2023-11-30 DIAGNOSIS — H401111 Primary open-angle glaucoma, right eye, mild stage: Secondary | ICD-10-CM | POA: Diagnosis not present

## 2023-12-01 ENCOUNTER — Other Ambulatory Visit: Payer: Self-pay

## 2023-12-01 DIAGNOSIS — J069 Acute upper respiratory infection, unspecified: Secondary | ICD-10-CM | POA: Diagnosis not present

## 2023-12-02 NOTE — Telephone Encounter (Unsigned)
 Copied from CRM #[id number]. Topic: Clinical - Lab/Test Results >> Dec 02, 2023 11:32 AM Abigail Stevens wrote: Reason for CRM: pt calling to see if her sample she did on yesterday was enough, advised her that if there is an issue someone would contact her and advise

## 2023-12-06 DIAGNOSIS — M5412 Radiculopathy, cervical region: Secondary | ICD-10-CM | POA: Diagnosis not present

## 2023-12-07 ENCOUNTER — Ambulatory Visit

## 2023-12-07 DIAGNOSIS — B999 Unspecified infectious disease: Secondary | ICD-10-CM

## 2023-12-08 ENCOUNTER — Telehealth: Payer: Self-pay

## 2023-12-08 NOTE — Telephone Encounter (Signed)
 Copied from CRM [id number]. Topic: Clinical - Lab/Test Results >> Dec 07, 2023 11:58 AM Devaughn RAMAN wrote: Reason for CRM: Patient called regarding labs. Pt stated she dropped off a specimen and she is not sure if it was enough and pt stated it has been over a week since she had labs done and she would like someone from the lab to give her a phone call.  Spoke with our lab person.  [facility] is delayed by 7 days.  Patients sputum sample is scheduled to be tested starting 12/10/2023.  This type of test (AFB Culture & Smear) normally will take about 6 weeks to fully test.  Left patient a message in MyChart, which is how the patient has been corresponding with all information.

## 2023-12-13 ENCOUNTER — Ambulatory Visit (INDEPENDENT_AMBULATORY_CARE_PROVIDER_SITE_OTHER): Admitting: Acute Care

## 2023-12-13 ENCOUNTER — Encounter: Payer: Self-pay | Admitting: Emergency Medicine

## 2023-12-13 ENCOUNTER — Encounter: Payer: Self-pay | Admitting: Acute Care

## 2023-12-13 ENCOUNTER — Telehealth: Payer: Self-pay | Admitting: Acute Care

## 2023-12-13 DIAGNOSIS — A499 Bacterial infection, unspecified: Secondary | ICD-10-CM | POA: Insufficient documentation

## 2023-12-13 DIAGNOSIS — W6199XA Other contact with other birds, initial encounter: Secondary | ICD-10-CM | POA: Diagnosis not present

## 2023-12-13 DIAGNOSIS — R918 Other nonspecific abnormal finding of lung field: Secondary | ICD-10-CM | POA: Diagnosis not present

## 2023-12-13 DIAGNOSIS — A318 Other mycobacterial infections: Secondary | ICD-10-CM | POA: Insufficient documentation

## 2023-12-13 DIAGNOSIS — R5383 Other fatigue: Secondary | ICD-10-CM

## 2023-12-13 NOTE — Progress Notes (Signed)
 Virtual Visit via Video Note  I connected with Abigail Stevens on 12/13/23 at  3:30 PM EST by a video enabled telemedicine application and verified that I am speaking with the correct person using two identifiers.  Location: Patient:  At home Provider:  [address], [place], [place], [address]    I discussed the limitations of evaluation and management by telemedicine and the availability of in person appointments. The patient expressed understanding and agreed to proceed.  History of Present Illness: 68 year old female with past medical history of basal cell carcinoma right cheek, melanoma on back, depression hypothyroidism migraines. Patient was referred after having coronary calcium CT scoring completing at the beginning of June 2023. Coronary calcium score was 0. However on the lung windows there was a right lower lobe pulmonary nodule few scattered nodules largest being 4 mm in size. No follow-up was considered needed if low risk however patient does have a history of melanoma patient was referred for evaluation of pulmonary nodules. Patient is a former smoker quit greater than 16 years ago. From respiratory standpoint is doing well. 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 Plan was for follow up Ct Chest in 3 months, referral to immunology, and sputum cultures for assessment. CT chest showed overall findings suggest an ongoing infectious/inflammatory process.She was treated with antibiotics and repeat imaging showed persistent nodularity in the anterior segment of the right lower lobe and basilar right middle lobe, consistent with a chronic inflammatory process such as atypical mycobacterial infection.She has been unable to produce sputum for cultures. She state she is concerned that she  will be unable to do so. She has been around bird droppings and is very anxious about what this might be. I spoke with Dr. Shelah, and he is in agreement to doing a  bronch with BAL and cultures for definitive diagnosis. Today's video visit is to get her bronchoscopy scheduled.  She  continues to feel she has secretions she cannot mobilize.She has significant fatigue. She has a beach house and has to frequently clean bird droppings off furniture and hurricane window covers. She does not wear gloves or a mask while doing this. She is concerned this may have lead to exposure to  bacteria. She would like to move forward with a bronch with BAL.   Observations/Objective: Speaking conversational. Able to speak in full sentences.   Assessment and Plan: Concern for atypical mycobacterial infection per CT chest. Fatigue Exposure to bird droppings Unable to cough up sputum for culture Plan I have placed an order for a bronchoscopy with BAL ( Bronchial Alveolar Lavage) with cultures.  We have discussed the procedure in detail.  We have reviewed the risks and benefits of the procedure. These include bleeding, infection, puncture of the lung, and adverse reaction to anesthesia. You have agreed to proceed with biopsy to obtain cultures from your lungs you have not been able to cough up on your own. Your procedure will be done by Dr. Lamar Shelah. You will receive a letter in the mail with date time and information pertaining to the procedure. You will need someone to drive you to the procedure, stay with you during the procedure, and stay with you after the procedure. You will also need someone to stay with you for 24 hours after anesthesia to ensure you have cleared and are doing well. You will follow-up with me 1 week after the procedure to review the results of  cultures and to ensure you are doing well. Call if you need us  prior to the procedure or if you have any questions at all. Please contact office for sooner follow up if symptoms do not improve or worsen or seek emergency care   Follow Up Instructions: You will follow-up with me 1 week after the procedure to  review the results of cultures and to ensure you are doing well. Call if you need us  prior to the procedure or if you have any questions at all. Please contact office for sooner follow up if symptoms do not improve or worsen or seek emergency care    I discussed the assessment and treatment plan with the patient. The patient was provided an opportunity to ask questions and all were answered. The patient agreed with the plan and demonstrated an understanding of the instructions.   The patient was advised to call back or seek an in-person evaluation if the symptoms worsen or if the condition fails to improve as anticipated.  I provided 25 minutes of non-face-to-face time during this encounter.   Abigail JULIANNA Lites, NP

## 2023-12-13 NOTE — Patient Instructions (Addendum)
 It was good to speak with you today. I have placed an order for a bronchoscopy with BAL ( Bronchial Alveolar Lavage) with cultures.  We have discussed the procedure in detail.  We have reviewed the risks and benefits of the procedure. These include bleeding, infection, puncture of the lung, and adverse reaction to anesthesia. You have agreed to proceed with biopsy to obtain cultures from your lungs you have not been able to cough up on your own. Your procedure will be done by Dr. Lamar Chris. You will receive a letter in the mail with date time and information pertaining to the procedure. You will need someone to drive you to the procedure, stay with you during the procedure, and stay with you after the procedure. You will also need someone to stay with you for 24 hours after anesthesia to ensure you have cleared and are doing well. You will follow-up with me 1 week after the procedure to review the results of cultures and to ensure you are doing well. Call if you need us  prior to the procedure or if you have any questions at all. Please contact office for sooner follow up if symptoms do not improve or worsen or seek emergency care

## 2023-12-13 NOTE — Telephone Encounter (Signed)
 Please schedule the following:  Provider performing procedure: [place] Diagnosis:  ? Mycobacterial infection Which side if for nodule / mass?  Bilateral Procedure:  Bronch with BAL and cultures  Has patient been spoken to by Provider and given informed consent?  Anesthesia: General  Duration of procedure:  1 hour Date:  12/28/2023 Alternate Date: 12/27/2023  Time: Second case of the day, arrive about 7:30 and 9 ish case Location:  [facility] Does patient have OSA?  No DM?  No Or Latex allergy?  No Medication Restriction/ Anticoagulate/Antiplatelet: None Pre-op Labs Ordered:determined by Anesthesia Imaging request: None  (If, SuperDimension CT Chest, please have STAT courier sent to ENDO)  Last CT Chest was 10/2023, but as this is not navigational should not need additional imaging.

## 2023-12-13 NOTE — H&P (View-Only) (Signed)
 Virtual Visit via Video Note  I connected with Abigail Stevens on 12/13/23 at  3:30 PM EST by a video enabled telemedicine application and verified that I am speaking with the correct person using two identifiers.  Location: Patient:  At home Provider:  [address], [place], [place], [address]    I discussed the limitations of evaluation and management by telemedicine and the availability of in person appointments. The patient expressed understanding and agreed to proceed.  History of Present Illness: 68 year old female with past medical history of basal cell carcinoma right cheek, melanoma on back, depression hypothyroidism migraines. Patient was referred after having coronary calcium CT scoring completing at the beginning of June 2023. Coronary calcium score was 0. However on the lung windows there was a right lower lobe pulmonary nodule few scattered nodules largest being 4 mm in size. No follow-up was considered needed if low risk however patient does have a history of melanoma patient was referred for evaluation of pulmonary nodules. Patient is a former smoker quit greater than 16 years ago. From respiratory standpoint is doing well. 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 Plan was for follow up Ct Chest in 3 months, referral to immunology, and sputum cultures for assessment. CT chest showed overall findings suggest an ongoing infectious/inflammatory process.She was treated with antibiotics and repeat imaging showed persistent nodularity in the anterior segment of the right lower lobe and basilar right middle lobe, consistent with a chronic inflammatory process such as atypical mycobacterial infection.She has been unable to produce sputum for cultures. She state she is concerned that she  will be unable to do so. She has been around bird droppings and is very anxious about what this might be. I spoke with Dr. Shelah, and he is in agreement to doing a  bronch with BAL and cultures for definitive diagnosis. Today's video visit is to get her bronchoscopy scheduled.  She  continues to feel she has secretions she cannot mobilize.She has significant fatigue. She has a beach house and has to frequently clean bird droppings off furniture and hurricane window covers. She does not wear gloves or a mask while doing this. She is concerned this may have lead to exposure to  bacteria. She would like to move forward with a bronch with BAL.   Observations/Objective: Speaking conversational. Able to speak in full sentences.   Assessment and Plan: Concern for atypical mycobacterial infection per CT chest. Fatigue Exposure to bird droppings Unable to cough up sputum for culture Plan I have placed an order for a bronchoscopy with BAL ( Bronchial Alveolar Lavage) with cultures.  We have discussed the procedure in detail.  We have reviewed the risks and benefits of the procedure. These include bleeding, infection, puncture of the lung, and adverse reaction to anesthesia. You have agreed to proceed with biopsy to obtain cultures from your lungs you have not been able to cough up on your own. Your procedure will be done by Dr. Lamar Shelah. You will receive a letter in the mail with date time and information pertaining to the procedure. You will need someone to drive you to the procedure, stay with you during the procedure, and stay with you after the procedure. You will also need someone to stay with you for 24 hours after anesthesia to ensure you have cleared and are doing well. You will follow-up with me 1 week after the procedure to review the results of  cultures and to ensure you are doing well. Call if you need us  prior to the procedure or if you have any questions at all. Please contact office for sooner follow up if symptoms do not improve or worsen or seek emergency care   Follow Up Instructions: You will follow-up with me 1 week after the procedure to  review the results of cultures and to ensure you are doing well. Call if you need us  prior to the procedure or if you have any questions at all. Please contact office for sooner follow up if symptoms do not improve or worsen or seek emergency care    I discussed the assessment and treatment plan with the patient. The patient was provided an opportunity to ask questions and all were answered. The patient agreed with the plan and demonstrated an understanding of the instructions.   The patient was advised to call back or seek an in-person evaluation if the symptoms worsen or if the condition fails to improve as anticipated.  I provided 25 minutes of non-face-to-face time during this encounter.   Abigail JULIANNA Lites, NP

## 2023-12-15 ENCOUNTER — Ambulatory Visit: Admitting: Acute Care

## 2023-12-27 ENCOUNTER — Encounter (HOSPITAL_COMMUNITY): Payer: Self-pay | Admitting: Emergency Medicine

## 2023-12-27 ENCOUNTER — Other Ambulatory Visit: Payer: Self-pay

## 2023-12-27 DIAGNOSIS — M5412 Radiculopathy, cervical region: Secondary | ICD-10-CM | POA: Diagnosis not present

## 2023-12-27 DIAGNOSIS — M5031 Other cervical disc degeneration,  high cervical region: Secondary | ICD-10-CM | POA: Diagnosis not present

## 2023-12-27 NOTE — Progress Notes (Signed)
 SDW CALL  Patient was given pre-op instructions over the phone. The opportunity was given for the patient to ask questions. No further questions asked. Patient verbalized understanding of instructions given.   PCP - Lamarr Rotunda at [facility] Cardiologist - denies  PPM/ICD - denies Device Orders - n/a Rep Notified - n/a  Chest CT - 11/01/23 EKG - denies Stress Test - denies ECHO - denies Cardiac Cath - denies  Sleep Study - denies  No DM  Last dose of GLP1 agonist-  n/a GLP1 instructions:  n/a  Blood Thinner Instructions: n/a Aspirin Instructions: n/a  ERAS Protcol - NPO PRE-SURGERY Ensure or G2- n/a  COVID TEST- n/a   Anesthesia review: no  Patient denies shortness of breath, fever, cough and chest pain over the phone call   All instructions explained to the patient, with a verbal understanding of the material. Patient agrees to go over the instructions while at home for a better understanding.

## 2023-12-28 ENCOUNTER — Ambulatory Visit (HOSPITAL_COMMUNITY): Payer: Self-pay

## 2023-12-28 ENCOUNTER — Encounter (HOSPITAL_COMMUNITY)
Admission: RE | Disposition: A | Discharge status: Home / Self Care | Payer: Self-pay | Source: Home / Self Care | Attending: Emergency Medicine

## 2023-12-28 ENCOUNTER — Ambulatory Visit (HOSPITAL_BASED_OUTPATIENT_CLINIC_OR_DEPARTMENT_OTHER): Payer: Self-pay

## 2023-12-28 ENCOUNTER — Ambulatory Visit (HOSPITAL_COMMUNITY)
Admission: RE | Admit: 2023-12-28 | Discharge: 2023-12-28 | Disposition: A | Discharge status: Home / Self Care | Attending: Emergency Medicine | Admitting: Emergency Medicine

## 2023-12-28 DIAGNOSIS — Z87891 Personal history of nicotine dependence: Secondary | ICD-10-CM | POA: Diagnosis not present

## 2023-12-28 DIAGNOSIS — J479 Bronchiectasis, uncomplicated: Secondary | ICD-10-CM | POA: Diagnosis not present

## 2023-12-28 DIAGNOSIS — Z85820 Personal history of malignant melanoma of skin: Secondary | ICD-10-CM | POA: Insufficient documentation

## 2023-12-28 DIAGNOSIS — R918 Other nonspecific abnormal finding of lung field: Secondary | ICD-10-CM

## 2023-12-28 DIAGNOSIS — A499 Bacterial infection, unspecified: Secondary | ICD-10-CM | POA: Insufficient documentation

## 2023-12-28 DIAGNOSIS — Z85828 Personal history of other malignant neoplasm of skin: Secondary | ICD-10-CM | POA: Diagnosis not present

## 2023-12-28 DIAGNOSIS — R911 Solitary pulmonary nodule: Secondary | ICD-10-CM | POA: Diagnosis not present

## 2023-12-28 HISTORY — PX: BRONCHIAL WASHINGS: SHX5105

## 2023-12-28 HISTORY — PX: FLEXIBLE BRONCHOSCOPY: SHX5094

## 2023-12-28 HISTORY — DX: Unspecified osteoarthritis, unspecified site: M19.90

## 2023-12-28 LAB — CBC
HCT: 38.9 % (ref 36.0–46.0)
Hemoglobin: 12.7 g/dL (ref 12.0–15.0)
MCH: 31.4 pg (ref 26.0–34.0)
MCHC: 32.6 g/dL (ref 30.0–36.0)
MCV: 96.3 fL (ref 80.0–100.0)
Platelets: 213 K/uL (ref 150–400)
RBC: 4.04 MIL/uL (ref 3.87–5.11)
RDW: 13.4 % (ref 11.5–15.5)
WBC: 4.7 K/uL (ref 4.0–10.5)
nRBC: 0 % (ref 0.0–0.2)

## 2023-12-28 LAB — BODY FLUID CELL COUNT WITH DIFFERENTIAL
Eos, Fluid: 1 %
Lymphs, Fluid: 36 %
Monocyte-Macrophage-Serous Fluid: 30 % — ABNORMAL LOW (ref 50–90)
Neutrophil Count, Fluid: 33 % — ABNORMAL HIGH (ref 0–25)
Total Nucleated Cell Count, Fluid: 24 uL (ref 0–1000)

## 2023-12-28 SURGERY — BRONCHOSCOPY, FLEXIBLE
Anesthesia: General | Laterality: Bilateral

## 2023-12-28 MED ORDER — OXYCODONE HCL 5 MG/5ML PO SOLN: 5.0000 mg | Freq: Once | ORAL | Status: DC | PRN

## 2023-12-28 MED ORDER — FENTANYL CITRATE (PF) 250 MCG/5ML IJ SOLN
INTRAMUSCULAR | Status: DC | PRN
Start: 2023-12-28 — End: 2023-12-28
  Administered 2023-12-28 (×2): 50 ug via INTRAVENOUS

## 2023-12-28 MED ORDER — ACETAMINOPHEN 500 MG PO TABS
1000.0000 mg | ORAL_TABLET | Freq: Once | ORAL | Status: AC
  Administered 2023-12-28: 1000 mg via ORAL
  Filled 2023-12-28: qty 2

## 2023-12-28 MED ORDER — ONDANSETRON HCL 4 MG/2ML IJ SOLN
INTRAMUSCULAR | Status: DC | PRN
  Administered 2023-12-28: 4 mg via INTRAVENOUS

## 2023-12-28 MED ORDER — EPHEDRINE SULFATE-NACL 50-0.9 MG/10ML-% IV SOSY
PREFILLED_SYRINGE | INTRAVENOUS | Status: DC | PRN
  Administered 2023-12-28: 10 mg via INTRAVENOUS
  Administered 2023-12-28: 5 mg via INTRAVENOUS

## 2023-12-28 MED ORDER — FENTANYL CITRATE (PF) 100 MCG/2ML IJ SOLN: 25.0000 ug | INTRAMUSCULAR | Status: DC | PRN

## 2023-12-28 MED ORDER — PROPOFOL 10 MG/ML IV BOLUS
INTRAVENOUS | Status: DC | PRN
  Administered 2023-12-28: 100 mg via INTRAVENOUS
  Administered 2023-12-28: 150 ug/kg/min via INTRAVENOUS

## 2023-12-28 MED ORDER — LIDOCAINE 2% (20 MG/ML) 5 ML SYRINGE
INTRAMUSCULAR | Status: DC | PRN
  Administered 2023-12-28: 40 mg via INTRAVENOUS

## 2023-12-28 MED ORDER — CHLORHEXIDINE GLUCONATE 0.12 % MT SOLN
OROMUCOSAL | Status: AC
  Administered 2023-12-28: 15 mL
  Filled 2023-12-28: qty 15

## 2023-12-28 MED ORDER — LACTATED RINGERS IV SOLN: INTRAVENOUS | Status: DC

## 2023-12-28 MED ORDER — OXYCODONE HCL 5 MG PO TABS: 5.0000 mg | ORAL_TABLET | Freq: Once | ORAL | Status: DC | PRN

## 2023-12-28 MED ORDER — FENTANYL CITRATE (PF) 100 MCG/2ML IJ SOLN
INTRAMUSCULAR | Status: AC
  Filled 2023-12-28: qty 2

## 2023-12-28 MED ORDER — ROCURONIUM BROMIDE 10 MG/ML (PF) SYRINGE
PREFILLED_SYRINGE | INTRAVENOUS | Status: DC | PRN
  Administered 2023-12-28: 50 mg via INTRAVENOUS

## 2023-12-28 MED ORDER — MEPERIDINE HCL 25 MG/ML IJ SOLN: 6.2500 mg | INTRAMUSCULAR | Status: DC | PRN

## 2023-12-28 MED ORDER — SUGAMMADEX SODIUM 200 MG/2ML IV SOLN
INTRAVENOUS | Status: DC | PRN
  Administered 2023-12-28 (×2): 100 mg via INTRAVENOUS
  Administered 2023-12-28: 200 mg via INTRAVENOUS

## 2023-12-28 MED ORDER — DEXAMETHASONE SOD PHOSPHATE PF 10 MG/ML IJ SOLN
INTRAMUSCULAR | Status: DC | PRN
  Administered 2023-12-28: 10 mg via INTRAVENOUS

## 2023-12-28 MED ORDER — MIDAZOLAM HCL (PF) 2 MG/2ML IJ SOLN: 0.5000 mg | Freq: Once | INTRAMUSCULAR | Status: DC | PRN

## 2023-12-28 NOTE — Discharge Instructions (Signed)

## 2023-12-28 NOTE — Anesthesia Preprocedure Evaluation (Addendum)
 Anesthesia Evaluation  Patient identified by MRN, date of birth, ID band Patient awake    Reviewed: Allergy & Precautions, NPO status , Patient's Chart, lab work & pertinent test results  History of Anesthesia Complications Negative for: history of anesthetic complications  Airway Mallampati: I  TM Distance: >3 FB Neck ROM: Full    Dental  (+) Dental Advisory Given   Pulmonary former smoker cultures for concern for mycobacterial infection   breath sounds clear to auscultation       Cardiovascular (-) angina negative cardio ROS  Rhythm:Regular Rate:Normal     Neuro/Psych   Anxiety Depression    negative neurological ROS     GI/Hepatic Neg liver ROS,GERD  Medicated and Controlled,,  Endo/Other  Hypothyroidism    Renal/GU negative Renal ROS     Musculoskeletal   Abdominal   Peds  Hematology Hb 12.7, plt 213k   Anesthesia Other Findings   Reproductive/Obstetrics                              Anesthesia Physical Anesthesia Plan  ASA: 2  Anesthesia Plan: General   Post-op Pain Management: Tylenol PO (pre-op)*   Induction: Intravenous  PONV Risk Score and Plan: 3 and Ondansetron  and Dexamethasone  Airway Management Planned: Oral ETT  Additional Equipment: None  Intra-op Plan:   Post-operative Plan: Extubation in OR  Informed Consent: I have reviewed the patients History and Physical, chart, labs and discussed the procedure including the risks, benefits and alternatives for the proposed anesthesia with the patient or authorized representative who has indicated his/her understanding and acceptance.     Dental advisory given  Plan Discussed with: CRNA and Surgeon  Anesthesia Plan Comments:          Anesthesia Quick Evaluation

## 2023-12-28 NOTE — Op Note (Signed)
 Video Bronchoscopy Procedure Note  Date of Operation: 12/28/2023  Pre-op Diagnosis: Bilateral tree-in-bud nodularity  Post-op Diagnosis: Same  Surgeon: LAMAR CHRIS  Assistants: none  Anesthesia: General anesthesia  Operation: Flexible video fiberoptic bronchoscopy and biopsies.  Estimated Blood Loss: 0 cc  Complications: none noted  Indications and History: Abigail Stevens is 68 y.o. with history of basal cell carcinoma right cheek, melanoma (back).  She was found to have scattered bronchiolectasis and tree-in-bud nodularity on CT chest, initially from coronary calcium CT 07/2021.  There has been waxing and waning on subsequent imaging.  Recommendation was to perform video fiberoptic bronchoscopy with bronchioloalveolar lavage. The risks, benefits, complications, treatment options and expected outcomes were discussed with the patient.  The possibilities of pneumothorax, pneumonia, reaction to medication, pulmonary aspiration, perforation of a viscus, bleeding, failure to diagnose a condition and creating a complication requiring transfusion or operation were discussed with the patient who freely signed the consent.    Description of Procedure: The patient was seen in the Preoperative Area, was examined and was deemed appropriate to proceed.  The patient was taken to [facility] endoscopy room 3, identified as [name] and the procedure verified as Flexible Video Fiberoptic Bronchoscopy.  A Time Out was held and the above information confirmed.   General anesthesia was initiated and the video fiberoptic bronchoscope was introduced through the endotracheal tube.  Full airway inspection was performed.  The distal trachea was normal.  The main carina was sharp.  The right sided airway examination was normal without any evidence of endobronchial lesion or abnormal secretions.  The left upper lobe and left lower lobe airways were also normal.  Again there were no endobronchial  lesions.  Bronchoalveolar lavage with total 90 cc normal saline instilled and approximately 25-30 cc returned was performed in the anterior segment of the right upper lobe to be sent for cell count and differential, all microbiology studies. The patient tolerated the procedure well. The bronchoscope was removed. There were no obvious complications.   Samples: 1.  Bronchoalveolar lavage from the right upper lobe  Plans:  We will review the cytology, pathology and microbiology results with the patient when they become available.  Outpatient followup will be with CANDIE Lites, NP and  Dr Chris.    Lamar Chris, MD, PhD 12/28/2023, 9:27 AM [facility] [phone] or if no answer [phone]

## 2023-12-28 NOTE — Addendum Note (Signed)
 Addendum  created 12/28/23 1557 by Roslynn Waddell LABOR, CRNA   Intraprocedure Event edited, Intraprocedure Meds edited, Intraprocedure Staff edited

## 2023-12-28 NOTE — Anesthesia Procedure Notes (Signed)
 Procedure Name: Intubation Date/Time: 12/28/2023 9:31 AM  Performed by: Roslynn Waddell LABOR, CRNAPre-anesthesia Checklist: Patient identified, Emergency Drugs available, Suction available and Patient being monitored Patient Re-evaluated:Patient Re-evaluated prior to induction Oxygen Delivery Method: Circle System Utilized Preoxygenation: Pre-oxygenation with 100% oxygen Induction Type: IV induction Ventilation: Mask ventilation without difficulty Laryngoscope Size: Mac and 3 Grade View: Grade I Tube type: Oral Tube size: 7.0 mm Number of attempts: 1 Airway Equipment and Method: Stylet and Oral airway Placement Confirmation: ETT inserted through vocal cords under direct vision, positive ETCO2 and breath sounds checked- equal and bilateral Secured at: 21 cm Tube secured with: Tape Dental Injury: Teeth and Oropharynx as per pre-operative assessment  Comments: Atraumatic induction/intubation. Dentition and oral mucosa as per preop. DL and intubation by [facility]

## 2023-12-28 NOTE — Transfer of Care (Signed)
 Immediate Anesthesia Transfer of Care Note  Patient: Abigail Stevens  Procedure(s) Performed: BRONCHOSCOPY, FLEXIBLE (Bilateral) IRRIGATION, BRONCHUS  Patient Location: PACU and Endoscopy Unit  Anesthesia Type:General  Level of Consciousness: awake and alert   Airway & Oxygen Therapy: Patient Spontanous Breathing and Patient connected to face mask oxygen  Post-op Assessment: Report given to RN and Post -op Vital signs reviewed and stable  Post vital signs: Reviewed and stable  Last Vitals:  Vitals Value Taken Time  BP 139/80 12/28/23 09:55  Temp    Pulse 63 12/28/23 09:59  Resp 12 12/28/23 09:59  SpO2 100 % 12/28/23 09:59  Vitals shown include unfiled device data.  Last Pain:  Vitals:   12/28/23 0720  TempSrc: Oral  PainSc: 0-No pain         Complications: No notable events documented.

## 2023-12-28 NOTE — Interval H&P Note (Signed)
 History and Physical Interval Note:  12/28/2023 7:51 AM  Abigail Stevens  has presented today for surgery, with the diagnosis of cultures for concern for mycobacterial infection.  The various methods of treatment have been discussed with the patient and family. After consideration of risks, benefits and other options for treatment, the patient has consented to  Procedure(s) with comments: BRONCHOSCOPY, FLEXIBLE (Bilateral) - WITH BAL AND CULTURES as a surgical intervention.  The patient's history has been reviewed, patient examined, no change in status, stable for surgery.  I have reviewed the patient's chart and labs.  Questions were answered to the patient's satisfaction.     Lamar GORMAN Chris

## 2023-12-28 NOTE — Anesthesia Postprocedure Evaluation (Signed)
 Anesthesia Post Note  Patient: Abigail Stevens  Procedure(s) Performed: BRONCHOSCOPY, FLEXIBLE (Bilateral) IRRIGATION, BRONCHUS     Patient location during evaluation: PACU Anesthesia Type: General Level of consciousness: awake and alert, patient cooperative and oriented Pain management: pain level controlled Vital Signs Assessment: post-procedure vital signs reviewed and stable Respiratory status: spontaneous breathing, nonlabored ventilation and respiratory function stable Cardiovascular status: blood pressure returned to baseline and stable Postop Assessment: no apparent nausea or vomiting and able to ambulate Anesthetic complications: no   No notable events documented.  Last Vitals:  Vitals:   12/28/23 0955 12/28/23 1000  BP: 139/80 (!) 148/78  Pulse: 68 60  Resp: 17 13  Temp: (!) 36.3 C   SpO2: 100% 100%    Last Pain:  Vitals:   12/28/23 1000  TempSrc:   PainSc: 0-No pain                 Stephinie Battisti,E. Sheehan Stacey

## 2023-12-29 ENCOUNTER — Encounter: Payer: Self-pay | Admitting: Allergy and Immunology

## 2023-12-30 ENCOUNTER — Encounter (HOSPITAL_COMMUNITY): Payer: Self-pay | Admitting: Emergency Medicine

## 2023-12-30 ENCOUNTER — Other Ambulatory Visit: Payer: Self-pay

## 2023-12-30 DIAGNOSIS — B999 Unspecified infectious disease: Secondary | ICD-10-CM

## 2023-12-30 LAB — STREP PNEUMONIAE 23 SEROTYPES IGG
Pneumo Ab Type 1*: 5.5 ug/mL (ref >1.3)
Pneumo Ab Type 12 (12F)*: 3.4 ug/mL (ref >1.3)
Pneumo Ab Type 14*: 9.6 ug/mL (ref >1.3)
Pneumo Ab Type 17 (17F)*: 6.3 ug/mL (ref >1.3)
Pneumo Ab Type 19 (19F)*: 3.4 ug/mL (ref >1.3)
Pneumo Ab Type 2*: 0.2 ug/mL — AB (ref >1.3)
Pneumo Ab Type 20*: 4.5 ug/mL (ref >1.3)
Pneumo Ab Type 22 (22F)*: 2.1 ug/mL (ref >1.3)
Pneumo Ab Type 23 (23F)*: 5.2 ug/mL (ref >1.3)
Pneumo Ab Type 26 (6B)*: 0.7 ug/mL — AB (ref >1.3)
Pneumo Ab Type 3*: 0.7 ug/mL — AB (ref >1.3)
Pneumo Ab Type 34 (10A)*: 0.3 ug/mL — AB (ref >1.3)
Pneumo Ab Type 4*: 1.6 ug/mL (ref >1.3)
Pneumo Ab Type 43 (11A)*: 2.1 ug/mL (ref >1.3)
Pneumo Ab Type 5*: 5 ug/mL (ref >1.3)
Pneumo Ab Type 51 (7F)*: 2 ug/mL (ref >1.3)
Pneumo Ab Type 54 (15B)*: 7.6 ug/mL (ref >1.3)
Pneumo Ab Type 56 (18C)*: 2.6 ug/mL (ref >1.3)
Pneumo Ab Type 57 (19A)*: 3.9 ug/mL (ref >1.3)
Pneumo Ab Type 68 (9V)*: 2.8 ug/mL (ref >1.3)
Pneumo Ab Type 70 (33F)*: 4.7 ug/mL (ref >1.3)
Pneumo Ab Type 8*: 13.6 ug/mL (ref >1.3)
Pneumo Ab Type 9 (9N)*: 6.2 ug/mL (ref >1.3)

## 2023-12-30 LAB — ACID FAST SMEAR (AFB, MYCOBACTERIA): Acid Fast Smear: NEGATIVE

## 2023-12-30 NOTE — Addendum Note (Signed)
 Addended by: MARCINE ISAIAH CROME on: 12/30/2023 09:35 AM   Modules accepted: Orders

## 2024-01-02 ENCOUNTER — Encounter: Payer: Self-pay | Admitting: Emergency Medicine

## 2024-01-02 LAB — AEROBIC/ANAEROBIC CULTURE W GRAM STAIN (SURGICAL/DEEP WOUND)
Culture: NO GROWTH
Gram Stain: NONE SEEN

## 2024-01-03 ENCOUNTER — Ambulatory Visit: Payer: Self-pay | Admitting: Allergy and Immunology

## 2024-01-03 MED ORDER — DOXYCYCLINE HYCLATE 100 MG PO TABS: 100.0000 mg | ORAL_TABLET | Freq: Two times a day (BID) | ORAL | 0 refills | Status: AC

## 2024-01-03 NOTE — Telephone Encounter (Signed)
 Given the green sputum, I believe she should be treated for possible post-bronchoscopy bronchitis. I will send script for doxycycline  to her pharmacy

## 2024-01-04 ENCOUNTER — Ambulatory Visit: Admitting: Acute Care

## 2024-01-04 ENCOUNTER — Encounter: Payer: Self-pay | Admitting: Emergency Medicine

## 2024-01-04 LAB — AFB IDENTIFICATION BY PCR
M avium complex: NEGATIVE
M tuberculosis complex: NEGATIVE

## 2024-01-04 LAB — ORGANISM ID BY MALDI

## 2024-01-04 LAB — MYCOBACTERIA ID BY MALDI

## 2024-01-04 LAB — AFB CULTURE WITH SMEAR (NOT AT ARMC)
Acid Fast Culture: POSITIVE — AB
Acid Fast Smear: NEGATIVE

## 2024-01-04 LAB — SPECIMEN STATUS REPORT

## 2024-01-05 ENCOUNTER — Encounter: Payer: Self-pay | Admitting: Emergency Medicine

## 2024-01-05 ENCOUNTER — Ambulatory Visit: Admitting: Emergency Medicine

## 2024-01-05 VITALS — BP 128/82 | HR 62 | Ht 62.0 in | Wt 104.8 lb

## 2024-01-05 DIAGNOSIS — A318 Other mycobacterial infections: Secondary | ICD-10-CM | POA: Diagnosis not present

## 2024-01-05 DIAGNOSIS — J479 Bronchiectasis, uncomplicated: Secondary | ICD-10-CM | POA: Diagnosis not present

## 2024-01-05 NOTE — Telephone Encounter (Signed)
 I called and spoke with the pt and scheduled sooner ov for today 11/26 with Dr. Shelah to review labs and bronch.  Per convo with Dr. Shelah he wanted to see the pt for OV.  NFN

## 2024-01-05 NOTE — Assessment & Plan Note (Addendum)
 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  Her CT chest has been waxing and waning, most recently actually less prominent micronodular disease.  That said she has daily symptoms, cough, sputum production and I suspect she needs to be treated.  I will refer her to infectious diseases clinic to continue the evaluation and to initiate therapy.  I will see her back in 3 months and then we will determine the timing of repeat chest imaging depending on whether she has started an antibiotic regimen or not.

## 2024-01-05 NOTE — Patient Instructions (Signed)
 We reviewed your respiratory cultures and bronchoscopy results today. 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 We will refer you to the infectious diseases clinic to discuss treatment of Mycobacterium abscessus. Follow Dr. Shelah in 3 months.  At that time we will determine the timing repeat chest imaging

## 2024-01-05 NOTE — Progress Notes (Signed)
 Subjective:    Patient ID: Abigail Stevens, female    DOB: 11/23/1955, 68 y.o.   MRN: [id number]  HPI 68 year old former smoker (25 pack years) with a history of basal cell carcinoma the right cheek, melanoma on her back, depression, hypothyroidism, migraines who follows up today for abnormal CT scan of the chest with some scattered parabronchovascular nodularity.  She has recurrent chronic respiratory symptoms including cough.  There has been some question of possible immunosuppression.  She has had respiratory cultures on 12/01/2023 and then underwent bronchoscopy with BAL on 12/28/2023 as below. She is coughing up thick yellow mucous since her bronchoscopy - started doxycyciline 4 days ago.    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  Fungal culture negative  CT chest 11/01/2023 reviewed by me showed persistent but improved tree-in-bud nodularity in the right upper lobe with residual 5 mm nodule.  Suggestive of an inflammatory process she has persistent nodularity anterior segment the right lower lobe, basilar right middle lobe, 5 mm nodule in the right lower lobe.  Subtle bronchiectasis and bronchiolectasis.   Review of Systems As per HPI  Past Medical History:  Diagnosis Date   Anxiety    Arthritis    Cancer (HCC)    basal cell carcinoma  right cheek and melanoma on back   Depression    Eczema    Hypothyroidism    Insomnia    Migraine    Neck pain    Recurrent upper respiratory infection (URI)    Shoulder pain      Family History  Problem Relation Age of Onset   Aneurysm Mother    Dementia Mother    Heart disease Father    Kidney disease Father    Diabetes Father    Migraines Father      Social History   Socioeconomic History   Marital status: Divorced    Spouse name: Not on file   Number of children: 2   Years of education: 16   Highest  education level: Not on file  Occupational History   Occupation: HR Consultant  Tobacco Use   Smoking status: Former    Current packs/day: 1.00    Average packs/day: 1 pack/day for 24.9 years (24.9 ttl pk-yrs)    Types: Cigarettes    Start date: 2001    Quit date: 1975   Smokeless tobacco: Never   Tobacco comments:    Quit January 2007  Substance and Sexual Activity   Alcohol use: Yes    Comment: 2 drinks, 2-3 times a week   Drug use: No   Sexual activity: Not on file  Other Topics Concern   Not on file  Social History Narrative   Lives at home with son and daughter.   Right-handed.   1-2 cups caffeine per day.   Social Drivers of Corporate Investment Banker Strain: Not on file  Food Insecurity: Not on file  Transportation Needs: Not on file  Physical Activity: Not on file  Stress: Not on file  Social Connections: Not on file  Intimate Partner Violence: Not on file     Allergies  Allergen Reactions   Codeine Nausea And Vomiting   White Petrolatum  Itching    Blisters skin Blisters skin      Outpatient Medications Prior to Visit  Medication Sig Dispense Refill   alendronate (FOSAMAX) 70 MG tablet Take 70 mg by mouth once  a week. Take with a full glass of water on an empty stomach.     ALPRAZolam (XANAX) 0.25 MG tablet as needed.     doxycycline  (VIBRA -TABS) 100 MG tablet Take 1 tablet (100 mg total) by mouth 2 (two) times daily. 14 tablet 0   escitalopram (LEXAPRO) 10 MG tablet 10 mg daily.     hydrocortisone  2.5 % cream Apply topically at bedtime. Apply to wet skin 454 g 1   levothyroxine (SYNTHROID, LEVOTHROID) 75 MCG tablet Take 75 mcg by mouth See admin instructions. Taking 75mcg Mon-Fri, 37.5mg  Sat-Sun.     meclizine (ANTIVERT) 25 MG tablet Take 25 mg by mouth 2 (two) times daily.     meloxicam (MOBIC) 7.5 MG tablet Take 7.5 mg by mouth as needed for pain.     ondansetron  (ZOFRAN -ODT) 4 MG disintegrating tablet Take 1 tablet (4 mg total) by mouth every 8  (eight) hours as needed for nausea or vomiting. 20 tablet 3   promethazine  (PHENERGAN ) 25 MG tablet Take 1 tablet (25 mg total) by mouth every 6 (six) hours as needed for nausea or vomiting. #30 per month. 15 tablet 3   rizatriptan  (MAXALT -MLT) 10 MG disintegrating tablet Take 1 tablet (10 mg total) by mouth as needed for migraine. May repeat in 2 hours if needed 9 tablet 11   SUMAtriptan  (IMITREX ) 100 MG tablet Take 1 tab at onset of migraine.  May repeat in 2 hrs, if needed.  Max dose: 2 tabs/day or 9 tabs/month. 90-day rx. 27 tablet 3   timolol (TIMOPTIC) 0.5 % ophthalmic solution Place into the right eye.     Travoprost, BAK Free, (TRAVATAN) 0.004 % SOLN ophthalmic solution Place 1 drop into both eyes at bedtime.     triamcinolone  (NASACORT ) 55 MCG/ACT AERO nasal inhaler Place 2 sprays into the nose every morning. 50.7 mL 1   pantoprazole  (PROTONIX ) 40 MG tablet Take 1 tablet (40 mg total) by mouth 2 (two) times daily. (Patient not taking: Reported on 01/05/2024) 180 tablet 1   No facility-administered medications prior to visit.         Objective:   Physical Exam  Vitals:   01/05/24 1452  BP: 128/82  Pulse: 62  SpO2: 97%  Weight: 104 lb 12.8 oz (47.5 kg)  Height: 5' 2 (1.575 m)   Gen: Pleasant, thin, in no distress,  normal affect  ENT: No lesions,  mouth clear,  oropharynx clear, no postnasal drip  Neck: No JVD, no stridor  Lungs: No use of accessory muscles, no crackles or wheezing on normal respiration, no wheeze on forced expiration  Cardiovascular: RRR, heart sounds normal, no murmur or gallops, no peripheral edema  Musculoskeletal: No deformities, no cyanosis or clubbing  Neuro: alert, awake, non focal  Skin: Warm, no lesions or rash       Assessment & Plan:  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  Her CT chest has  been waxing and waning, most recently actually less prominent micronodular disease.  That said she has daily symptoms, cough, sputum production and I suspect she needs to be treated.  I will refer her to infectious diseases clinic to continue the evaluation and to initiate therapy.  I will see her back in 3 months and then we will determine the timing of repeat chest imaging depending on whether she has started an antibiotic regimen or not.  Bronchiectasis without complication (  HCC) Mucus clearance mechanisms in place  I personally spent a total of 40 minutes in the care of the patient today including preparing to see the patient, getting/reviewing separately obtained history, performing a medically appropriate exam/evaluation, counseling and educating, placing orders, referring and communicating with other health care professionals, documenting clinical information in the EHR, independently interpreting results, and communicating results.   Lamar Chris, MD, PhD 01/05/2024, 3:58 PM [facility] [phone] or if no answer before 7:00PM call [phone] For any issues after 7:00PM please call eLink [phone]

## 2024-01-05 NOTE — Assessment & Plan Note (Signed)
 Mucus clearance mechanisms in place

## 2024-01-10 ENCOUNTER — Encounter: Payer: Self-pay | Admitting: Emergency Medicine

## 2024-01-10 NOTE — Telephone Encounter (Signed)
 Dr. Shelah, I have given her the information regarding the referral to infectious disease and she has an upcoming appointment.  Please advise on the continued symptoms she is having.  Th nk you.

## 2024-01-12 ENCOUNTER — Encounter: Payer: Self-pay | Admitting: Emergency Medicine

## 2024-01-12 MED ORDER — LEVOFLOXACIN 500 MG PO TABS: 500.0000 mg | ORAL_TABLET | Freq: Every day | ORAL | 0 refills | Status: AC

## 2024-01-12 NOTE — Telephone Encounter (Signed)
 I think that her symptoms are likely due to the Mycobacterium abscessus, which we are planning to treat with the assistance of ID.  Not clear to me that there would be a lot of benefit in treating for bronchitis or bronchiectasis flare with another antibiotic, but I do not think there would be significant downside so I have sent in a prescription for levofloxacin

## 2024-01-13 ENCOUNTER — Telehealth: Payer: Self-pay

## 2024-01-13 NOTE — Telephone Encounter (Signed)
 The AFB cultures from her bronchoscopy in November have not yet turned positive.  The positive result is from her sputum from October.  Antibiotic sensitivities are being run on this organism, can sometimes take weeks to come back.

## 2024-01-13 NOTE — Telephone Encounter (Signed)
 Antibiotic sensitivities are being checked. I don't think there isd any furthersub-speciation to be done

## 2024-01-13 NOTE — Telephone Encounter (Signed)
 Please advise

## 2024-01-20 ENCOUNTER — Encounter: Payer: Self-pay | Admitting: Internal Medicine

## 2024-01-20 ENCOUNTER — Ambulatory Visit: Admitting: Internal Medicine

## 2024-01-20 ENCOUNTER — Other Ambulatory Visit: Payer: Self-pay

## 2024-01-20 VITALS — BP 118/64 | HR 64 | Temp 97.7°F | Wt 105.0 lb

## 2024-01-20 DIAGNOSIS — R9389 Abnormal findings on diagnostic imaging of other specified body structures: Secondary | ICD-10-CM | POA: Diagnosis not present

## 2024-01-20 NOTE — Patient Instructions (Addendum)
 At this time we don't have all the criteria for M-abscessus yet   Let's wait on the bronchoscopy culture  Please send me a my chart message when you receive final bronch culture    Follow up with me 04/2024, unless if the culture is positive

## 2024-01-20 NOTE — Progress Notes (Signed)
 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 female here for m-abscessus evaluation   Baseline activity: Walks 5 miles a day    Since the bronchoscopy has had intermittent cough at times greenish but then turned yellow  Intermittent nightsweat but no fever chill. Noticed a few years off and on. Fatigue noted last 6 months  Dr Shelah gave her doxycycline  01/03/24 for 7 days then levofloxacin  01/12/24 for 7 days. The levofloxacin  seems to help more but unclear if due to just time  The cough had resolved after the levofloxacin   Appetite is good Maybe walking 4 miles now instead of 5 Fatigue she thinks is getting worse  She has an inhaler/neb but not using -- given after bronchoscopy  Quit smoking 02/2005. Smoked off and on 20 years about a pack a day  Work - event organiser. Retired  No indoors physicist, medical. However at the beach house lots of birds nest around her porch.   She usually goes every week to the beach house but not during the winter  She was being worked up for the clearing of sputum/cough. Allergy had tried to give her something for acid blocker but didn't seem to help. Extrinsic allergy panel negative  Repeat pneumococcal vaccine done prevnar 20 @ 12/2023 and pneumococcal response much improved. Testing otherwise doesn't indicate CVID    Review of Systems: ROS All the other ros negative         Past Medical History:  Diagnosis Date   Anxiety    Arthritis    Cancer (HCC)    basal cell carcinoma  right cheek and melanoma on back    Depression    Eczema    Hypothyroidism    Insomnia    Migraine    Neck pain    Recurrent upper respiratory infection (URI)    Shoulder pain     Social History[1]  Family History  Problem Relation Age of Onset   Aneurysm Mother    Dementia Mother    Heart disease Father    Kidney disease Father    Diabetes Father    Migraines Father     Allergies[2]  OBJECTIVE: Vitals:   01/20/24 0901  BP: 118/64  Pulse: 64  Temp: 97.7 F (36.5 C)  TempSrc: Oral  SpO2: 99%  Weight: 105 lb (47.6 kg)   Body mass index is 19.2 kg/m.   Physical Exam General/constitutional: no distress, pleasant HEENT: Normocephalic, PER, Conj Clear, EOMI, Oropharynx clear Neck supple CV: rrr no mrg Lungs: clear to auscultation, normal respiratory effort Abd: Soft, Nontender Ext: no edema Skin: No Rash Neuro: nonfocal MSK: no peripheral joint swelling/tenderness/warmth; back spines nontender   Lab: Lab Results  Component Value Date   WBC 4.7 12/28/2023   HGB 12.7 12/28/2023   HCT 38.9 12/28/2023   MCV 96.3 12/28/2023   PLT 213 12/28/2023   Last metabolic panel Lab Results  Component Value Date   GLUCOSE 94 10/26/2018   NA 140 10/26/2018   K 5.2 10/26/2018   CL 101 10/26/2018   CO2 25 10/26/2018   BUN  14 10/26/2018   CREATININE 0.97 10/26/2018   GFRNONAA 63 10/26/2018   CALCIUM 9.4 10/26/2018   PROT 6.7 10/26/2018   ALBUMIN 4.6 10/26/2018   LABGLOB 2.1 10/26/2018   AGRATIO 2.2 10/26/2018   BILITOT 0.4 10/26/2018   ALKPHOS 56 10/26/2018   AST 29 10/26/2018   ALT 12 10/26/2018   ANIONGAP 9 11/28/2015    Microbiology: 12/28/23 bronchoscopic sample Aerobic culture negative Afb culture and stain negative Fungal culture and stain negative   12/01/23 expectorated sputum (after drinking water) M-abscessus (no culture)  Serology:  Imaging: Reviewed:   11/01/23 chest ct 1. Improved tree-in-bud nodularity in the apical posterior right upper lobe with residual 5 mm  nodule. Per Fleischner Society Guidelines if patient is low risk for malignancy, no routine follow-up imaging is recommended. If patient is high risk for malignancy, a non-contrast chest CT at 12 months is optional. If performed and the nodule is stable at 12 months, no further follow-up is recommended. 2. Stable 5 mm pulmonary nodule in the right lower lobe. 3. Persistent nodularity in the anterior segment of the right lower lobe and basilar right middle lobe, consistent with a chronic inflammatory process such as atypical mycobacterial infection.   07/2023 chest ct 1. Interval development of multiple foci of tree-in-bud micro nodularity in the right upper lobe with the largest individual nodule measuring 0.4 cm. Additionally, areas of prior tree-in-bud micro nodularity in the anterolateral periphery of the right lower lobe and inferior aspect of the right middle lobe are slightly increased compared to prior with new ground-glass attenuation airspace opacity. Overall findings suggest an ongoing infectious/inflammatory process. Differential considerations include multifocal bronchopneumonia, atypical pneumonia and chronic indolent atypical infection such as MAI. Recommend attention on follow-up imaging following an appropriate course of antimicrobial therapy. 2. Otherwise, previously identified pulmonary nodule in the right lower lobe remains stable and there are no new suspicious pulmonary nodules to suggest metastatic disease.   07/2022 chest ct No significant interval change in the right lower lobe nodules compared to prior CT. The dominant nodule measures 5 mm. No follow-up needed if patient is low-risk (and has no known or suspected primary neoplasm). Non-contrast chest CT can be considered in 12 months if patient is high-risk   Assessment/plan: Problem List Items Addressed This Visit   None Visit Diagnoses       Abnormal chest CT    -  Primary         Question  regarding chest ct and a sputum cx m-abscessus Patient present with husband in room and son/daughter on the phone   Of note the sputum cx was done after she consume lots of water   Discussed criteria of m-abscessus infection So far what we have: May be imaging. Sx seems also improving -- cough since bronchoscopy. Fatigue could be due to another condition or multifactorial  Maybe reflux needs to be definitively r/o'ed   At this point would wait on bronchoscopy culture. If it is positive for ntm, would then monitor chest imaging and sx over time... I do not believe immediate need for treatment at this time although this is a multi-party decision and in the realm of ID opinion do vary as wel   F/u march 2026 or sooner if m-abscessus sputum negative  Discuss 2nd opinion welcome if she so inclines      Follow-up: Return in about 3 months (around 04/19/2024).  Constance ONEIDA Passer, MD [facility] 01/20/2024, 9:23 AM     [  1]  Social History Tobacco Use   Smoking status: Former    Current packs/day: 1.00    Average packs/day: 1 pack/day for 24.9 years (24.9 ttl pk-yrs)    Types: Cigarettes    Start date: 2001    Quit date: 1975   Smokeless tobacco: Never   Tobacco comments:    Quit January 2007  Substance Use Topics   Alcohol use: Yes    Comment: 2 drinks, 2-3 times a week   Drug use: No  [2]  Allergies Allergen Reactions   Codeine Nausea And Vomiting   White Petrolatum  Itching    Blisters skin Blisters skin

## 2024-01-21 ENCOUNTER — Encounter: Payer: Self-pay | Admitting: Internal Medicine

## 2024-01-27 LAB — FUNGAL ORGANISM REFLEX

## 2024-01-27 LAB — FUNGUS CULTURE WITH STAIN

## 2024-01-27 LAB — FUNGUS CULTURE RESULT

## 2024-02-12 LAB — ACID FAST CULTURE WITH REFLEXED SENSITIVITIES (MYCOBACTERIA): Acid Fast Culture: NEGATIVE

## 2024-02-14 ENCOUNTER — Encounter: Payer: Self-pay | Admitting: Internal Medicine

## 2024-02-24 ENCOUNTER — Ambulatory Visit: Admitting: Emergency Medicine

## 2024-03-02 ENCOUNTER — Encounter: Payer: Self-pay | Admitting: Neurology

## 2024-03-02 ENCOUNTER — Telehealth: Payer: Medicare Other | Admitting: Neurology

## 2024-03-02 DIAGNOSIS — G43709 Chronic migraine without aura, not intractable, without status migrainosus: Secondary | ICD-10-CM

## 2024-03-02 MED ORDER — RIZATRIPTAN BENZOATE 10 MG PO TBDP: 10.0000 mg | ORAL_TABLET | ORAL | 11 refills | Status: AC | PRN

## 2024-03-02 MED ORDER — SUMATRIPTAN SUCCINATE 100 MG PO TABS: ORAL_TABLET | ORAL | 3 refills | Status: AC

## 2024-03-02 NOTE — Progress Notes (Signed)
 "  Patient: Abigail Stevens Date of Birth: [birth year]  Reason for Visit: Follow up History from: Patient Primary Neurologist: On[facility]  Virtual Visit via Video Note  I connected with Abigail Stevens on 03/02/24 at  8:45 AM EST by a video enabled telemedicine application and verified that I am speaking with the correct person using two identifiers.  Location: Patient: at her home Provider: in the office    I discussed the limitations of evaluation and management by telemedicine and the availability of in person appointments. The patient expressed understanding and agreed to proceed.  ASSESSMENT AND PLAN 69 y.o. year old female   1.  Chronic migraine headache - Few more headaches lately, discussed addition of preventative.  She will check with insurance about CGRP options: Ajovy , Emgality, Nurtec, Qulipta - Will continue Maxalt  10 mg melt, also will refill Imitrex  100 mg tablet.  She alternates how she uses both of these medications. - Previously tried and failed Ajovy  (?), nortriptyline , Inderal  LA, Relpax, Imitrex  injection - Follow-up in 1 year or sooner if needed  HISTORY OF PRESENT ILLNESS:Abigail Stevens is a 69 years old right-handed female, seen in refer by her primary care physician from [facility] Dr. Lynwood Cramp in November 01 2014 for evaluation of frequent headaches   She reported a history of migraine headaches since 39 years old, her father also suffered typical migraines, her typical migraines are lateralized severe pounding headache was associated light noise sensitivity, nauseous, lasting for a few hours.  She has gone through a lot of stress since 2015, complains of increased headache over the past 2 years, for a while, she has been taking frequent ibuprofen, Tylenol , without helping her symptoms, now she has tapered off daily analgesic use, continue complains frequent almost daily variable degree of headaches.  She was not able to identify any clear triggers,  she also woke up with severe right side retro-orbital area pounding headaches, spreading from right frontal parietal to right neck and shoulder region, 8 out of 10, she has been taking Imitrex  tablet, which helps her some, but often she has to take second dose, she used upper 9 tablet monthly supply within 2 weeks,   Over the years, she has tried different preventive medications, butterbur, Inderal  60 mg twice a day, which seems to help her some, but the benefit does not sustain, she also has tried acupuncture, massage, chiropractor, ice pack, magnesium oxide, neck stretching exercise, ergonomic approach with limited help.   She is now using frequent ice pack, Imitrex  100 mg tablets as needed, Relpax works better, but it cost 150 $ for 4 tablets   Update April 25, 2020 SS: Here today alone, has 1-2 mild migraine headaches a week, will take Imitrex  tablet with good benefit. Keeps Imitrex  injection as needed, usually only uses twice a year. Keeps phenergan  PRN.  Had trouble getting the syringe with the Imitrex  injection last refill. Retired end of January. Scared of memory loss, her mother had Dementia, can lose her train of thought. Stopped B 12 injections last year, didn't do supplement. Seeing PCP next month. Has to be so careful with migraine triggers, affects her quality of life. MMSE 30/30 today.   02/25/21 AL: Abigail Stevens is a 69 y.o. female here today for follow up for migraines. She discontinued propranolol  ER 60mg  daily and nortriptyline  50mg  QHS but has continued Ajovy  monthly. She does not feel that migraines are any worse on Ajovy  alone. Sumatriptan  tablets and injections work well  in abortive therapy. She may need 6-9 tablets per month. She may use sumatriptan  injections twice a year. She does not take OTC analgesics. She feels that she is well managed at this time.    Update February 25, 2022 SS: She is off Ajovy , she didn't really see a difference and insurance wouldn't cover. 3-4 times a  year has a severe migraine with vomiting. Within a month has to take Imitrex /Phenergan  4-6 times, some of those may be on the same day. Would like to try a melt tablet. Will use good rx. Does not want to be on daily preventative. Enjoying retirement.   Update March 03, 2023 SS: Via VV, doing great. Gets 8 migraines a year. Takes triptan once every 2 weeks at onset of headache. Will take Maxalt  melt if really nauseated, otherwise take the Imitrex . Has phenergan  when needed but doesn't take often, often too late at that point. Has not tried the Nurtec.  Update 03/01/24 SS: Via VV, last few weeks waking up with headache 3-4 times a week. Has changed her pillow, has neck issues. Started new reflux medication. Takes Imitrex  PRN, uses ice on her neck. Will take Maxalt  melt when she is nauseated. Lately 3 times a week needing rescue, prior once a week.   REVIEW OF SYSTEMS: Out of a complete 14 system review of symptoms, the patient complains only of the following symptoms, and all other reviewed systems are negative.  See HPI  ALLERGIES: Allergies  Allergen Reactions   Codeine Nausea And Vomiting   White Petrolatum  Itching    Blisters skin Blisters skin     HOME MEDICATIONS: Outpatient Medications Prior to Visit  Medication Sig Dispense Refill   alendronate (FOSAMAX) 70 MG tablet Take 70 mg by mouth once a week. Take with a full glass of water on an empty stomach.     ALPRAZolam (XANAX) 0.25 MG tablet as needed.     doxycycline  (VIBRA -TABS) 100 MG tablet Take 1 tablet (100 mg total) by mouth 2 (two) times daily. (Patient not taking: Reported on 01/20/2024) 14 tablet 0   escitalopram (LEXAPRO) 10 MG tablet 10 mg daily.     hydrocortisone  2.5 % cream Apply topically at bedtime. Apply to wet skin 454 g 1   levofloxacin  (LEVAQUIN ) 500 MG tablet Take 1 tablet (500 mg total) by mouth daily. (Patient not taking: Reported on 01/20/2024) 7 tablet 0   levothyroxine (SYNTHROID, LEVOTHROID) 75 MCG tablet  Take 75 mcg by mouth See admin instructions. Taking 75mcg Mon-Fri, 37.5mg  Sat-Sun.     meclizine (ANTIVERT) 25 MG tablet Take 25 mg by mouth 2 (two) times daily.     meloxicam (MOBIC) 7.5 MG tablet Take 7.5 mg by mouth as needed for pain.     ondansetron  (ZOFRAN -ODT) 4 MG disintegrating tablet Take 1 tablet (4 mg total) by mouth every 8 (eight) hours as needed for nausea or vomiting. 20 tablet 3   pantoprazole  (PROTONIX ) 40 MG tablet Take 1 tablet (40 mg total) by mouth 2 (two) times daily. (Patient not taking: Reported on 01/20/2024) 180 tablet 1   promethazine  (PHENERGAN ) 25 MG tablet Take 1 tablet (25 mg total) by mouth every 6 (six) hours as needed for nausea or vomiting. #30 per month. 15 tablet 3   rizatriptan  (MAXALT -MLT) 10 MG disintegrating tablet Take 1 tablet (10 mg total) by mouth as needed for migraine. May repeat in 2 hours if needed 9 tablet 11   SUMAtriptan  (IMITREX ) 100 MG tablet Take 1 tab at onset  of migraine.  May repeat in 2 hrs, if needed.  Max dose: 2 tabs/day or 9 tabs/month. 90-day rx. 27 tablet 3   timolol (TIMOPTIC) 0.5 % ophthalmic solution Place into the right eye.     Travoprost, BAK Free, (TRAVATAN) 0.004 % SOLN ophthalmic solution Place 1 drop into both eyes at bedtime.     triamcinolone  (NASACORT ) 55 MCG/ACT AERO nasal inhaler Place 2 sprays into the nose every morning. 50.7 mL 1   No facility-administered medications prior to visit.    PAST MEDICAL HISTORY: Past Medical History:  Diagnosis Date   Anxiety    Arthritis    Cancer (HCC)    basal cell carcinoma  right cheek and melanoma on back   Depression    Eczema    Hypothyroidism    Insomnia    Migraine    Neck pain    Recurrent upper respiratory infection (URI)    Shoulder pain     PAST SURGICAL HISTORY: Past Surgical History:  Procedure Laterality Date   APPENDECTOMY     BRONCHIAL WASHINGS  12/28/2023   Procedure: IRRIGATION, BRONCHUS;  Surgeon: Shelah Lamar RAMAN, MD;  Location: [facility];   Service: Cardiopulmonary;;   CESAREAN SECTION     COLONOSCOPY     DEBRIDEMENT AND CLOSURE WOUND Right 11/28/2015   Procedure: COMPLEX WOUND CLOSER OF RIGHT CHEEK;  Surgeon: Alm Sick, MD;  Location: [facility] OR;  Service: Plastics;  Laterality: Right;   DILATION AND CURETTAGE OF UTERUS     FLEXIBLE BRONCHOSCOPY Bilateral 12/28/2023   Procedure: BRONCHOSCOPY, FLEXIBLE;  Surgeon: Shelah Lamar RAMAN, MD;  Location: [facility];  Service: Cardiopulmonary;  Laterality: Bilateral;  WITH BAL AND CULTURES   MELANOMA EXCISION Right 11/28/2015   Procedure: MELANOMA EXCISION;  Surgeon: Alm Sick, MD;  Location: [facility] OR;  Service: Plastics;  Laterality: Right;   TONSILLECTOMY     TUBAL LIGATION     WISDOM TOOTH EXTRACTION     FAMILY HISTORY: Family History  Problem Relation Age of Onset   Aneurysm Mother    Dementia Mother    Heart disease Father    Kidney disease Father    Diabetes Father    Migraines Father    SOCIAL HISTORY: Social History   Socioeconomic History   Marital status: Divorced    Spouse name: Not on file   Number of children: 2   Years of education: 16   Highest education level: Not on file  Occupational History   Occupation: HR Consultant  Tobacco Use   Smoking status: Former    Current packs/day: 1.00    Average packs/day: 1 pack/day for 25.1 years (25.1 ttl pk-yrs)    Types: Cigarettes    Start date: 2001    Quit date: 1975   Smokeless tobacco: Never   Tobacco comments:    Quit January 2007  Substance and Sexual Activity   Alcohol use: Yes    Comment: 2 drinks, 2-3 times a week   Drug use: No   Sexual activity: Not on file  Other Topics Concern   Not on file  Social History Narrative   Lives at home with son and daughter.   Right-handed.   1-2 cups caffeine per day.   Social Drivers of Health   Tobacco Use: Medium Risk (01/20/2024)   Patient History    Smoking Tobacco Use: Former    Smokeless Tobacco Use: Never    Passive Exposure: Not on Programmer, Applications Strain: Not on file  Food  Insecurity: Not on file  Transportation Needs: Not on file  Physical Activity: Not on file  Stress: Not on file  Social Connections: Not on file  Intimate Partner Violence: Not on file  Depression (EYV7-0): Not on file  Alcohol Screen: Not on file  Housing: Not on file  Utilities: Not on file  Health Literacy: Not on file   PHYSICAL EXAM  There were no vitals filed for this visit.  There is no height or weight on file to calculate BMI.  Generalized: Well developed, in no acute distress   Video Visit   DIAGNOSTIC DATA (LABS, IMAGING, TESTING) - I reviewed patient records, labs, notes, testing and imaging myself where available.  Lab Results  Component Value Date   WBC 4.7 12/28/2023   HGB 12.7 12/28/2023   HCT 38.9 12/28/2023   MCV 96.3 12/28/2023   PLT 213 12/28/2023      Component Value Date/Time   NA 140 10/26/2018 1013   K 5.2 10/26/2018 1013   CL 101 10/26/2018 1013   CO2 25 10/26/2018 1013   GLUCOSE 94 10/26/2018 1013   GLUCOSE 78 11/28/2015 1249   BUN 14 10/26/2018 1013   CREATININE 0.97 10/26/2018 1013   CALCIUM 9.4 10/26/2018 1013   PROT 6.7 10/26/2018 1013   ALBUMIN 4.6 10/26/2018 1013   AST 29 10/26/2018 1013   ALT 12 10/26/2018 1013   ALKPHOS 56 10/26/2018 1013   BILITOT 0.4 10/26/2018 1013   GFRNONAA 63 10/26/2018 1013   GFRAA 72 10/26/2018 1013   No results found for: CHOL, HDL, LDLCALC, LDLDIRECT, TRIG, CHOLHDL No results found for: YHAJ8R Lab Results  Component Value Date   VITAMINB12 230 (L) 10/26/2018   Lab Results  Component Value Date   TSH 0.476 10/26/2018    Lauraine Born, AGNP-C, DNP 03/02/2024, 5:40 AM [facility] [address], [address] [place], [place] [address] [phone]   "

## 2024-03-02 NOTE — Patient Instructions (Signed)
 Great to see you today! Consider preventative options Ajovy , Emgality, Qulipta, Nurtec Let me know your thoughts.  Follow-up 1 year.  Thanks!!

## 2024-05-02 ENCOUNTER — Ambulatory Visit: Payer: Self-pay | Admitting: Internal Medicine

## 2025-03-06 ENCOUNTER — Telehealth: Admitting: Neurology
